# Patient Record
Sex: Female | Born: 1960 | Race: White | Hispanic: No | Marital: Married | Smoking: Current some day smoker
Health system: Southern US, Community
[De-identification: ages and names within clinical notes are randomized; demographics above are authoritative.]

## PROBLEM LIST (undated history)

## (undated) DIAGNOSIS — J449 Chronic obstructive pulmonary disease, unspecified: Secondary | ICD-10-CM

## (undated) DIAGNOSIS — I10 Essential (primary) hypertension: Secondary | ICD-10-CM

## (undated) DIAGNOSIS — C801 Malignant (primary) neoplasm, unspecified: Secondary | ICD-10-CM

## (undated) DIAGNOSIS — J45909 Unspecified asthma, uncomplicated: Secondary | ICD-10-CM

## (undated) HISTORY — PX: CHOLECYSTECTOMY: SHX55

## (undated) HISTORY — PX: OTHER SURGICAL HISTORY: SHX169

---

## 2020-06-15 ENCOUNTER — Ambulatory Visit: Payer: Medicaid Other | Admitting: Gerontology

## 2020-06-15 ENCOUNTER — Other Ambulatory Visit: Payer: Self-pay

## 2020-07-10 ENCOUNTER — Emergency Department: Payer: Medicaid Other

## 2020-07-10 ENCOUNTER — Other Ambulatory Visit: Payer: Self-pay

## 2020-07-10 ENCOUNTER — Emergency Department
Admission: EM | Admit: 2020-07-10 | Discharge: 2020-07-10 | Disposition: A | Discharge status: Home / Self Care | Payer: Medicaid Other | Attending: Emergency Medicine | Admitting: Emergency Medicine

## 2020-07-10 DIAGNOSIS — Z5321 Procedure and treatment not carried out due to patient leaving prior to being seen by health care provider: Secondary | ICD-10-CM | POA: Diagnosis not present

## 2020-07-10 DIAGNOSIS — R0602 Shortness of breath: Secondary | ICD-10-CM | POA: Insufficient documentation

## 2020-07-10 HISTORY — DX: Essential (primary) hypertension: I10

## 2020-07-10 HISTORY — DX: Malignant (primary) neoplasm, unspecified: C80.1

## 2020-07-10 HISTORY — DX: Chronic obstructive pulmonary disease, unspecified: J44.9

## 2020-07-10 HISTORY — DX: Unspecified asthma, uncomplicated: J45.909

## 2020-07-10 LAB — BASIC METABOLIC PANEL WITH GFR
Anion gap: 14 (ref 5–15)
BUN: 22 mg/dL — ABNORMAL HIGH (ref 6–20)
CO2: 32 mmol/L (ref 22–32)
Calcium: 9.9 mg/dL (ref 8.9–10.3)
Chloride: 92 mmol/L — ABNORMAL LOW (ref 98–111)
Creatinine, Ser: 0.89 mg/dL (ref 0.44–1.00)
GFR calc Af Amer: >60 mL/min (ref >60)
GFR calc non Af Amer: >60 mL/min (ref >60)
Glucose, Bld: 130 mg/dL — ABNORMAL HIGH (ref 70–99)
Potassium: 3.2 mmol/L — ABNORMAL LOW (ref 3.5–5.1)
Sodium: 138 mmol/L (ref 135–145)

## 2020-07-10 LAB — CBC
HCT: 40.6 % (ref 36.0–46.0)
Hemoglobin: 13.4 g/dL (ref 12.0–15.0)
MCH: 28.2 pg (ref 26.0–34.0)
MCHC: 33 g/dL (ref 30.0–36.0)
MCV: 85.5 fL (ref 80.0–100.0)
Platelets: 328 10*3/uL (ref 150–400)
RBC: 4.75 MIL/uL (ref 3.87–5.11)
RDW: 12.6 % (ref 11.5–15.5)
WBC: 9.3 10*3/uL (ref 4.0–10.5)
nRBC: 0 % (ref 0.0–0.2)

## 2020-07-10 LAB — TROPONIN I (HIGH SENSITIVITY): Troponin I (High Sensitivity): <2 ng/L (ref <18)

## 2020-07-10 NOTE — ED Triage Notes (Signed)
Pt in via POV, reports ongoing shortness of breath x one week w/ fluctuation in oxygen saturation between 86-96%.  Reports currently on steroid and antibiotic therapy since Monday.  Vitals WDL, NAD noted.

## 2020-07-13 ENCOUNTER — Telehealth: Payer: Self-pay | Admitting: Emergency Medicine

## 2020-07-13 NOTE — Telephone Encounter (Signed)
Called patient due to lwot to inquire about condition and follow up plans. Says she is okay and has dr in [place].  She has made appt with new dr here in September.  She asked about labs.  I gave her potassium level and she will call her doctor and ask about taking potassium again as she has some left.  I told her the exact level so she can tell her doctor and they can make decision about taking potassium pills and dose.  I told her that she may return any time if she gets In trouble with her breathing.

## 2021-03-29 ENCOUNTER — Encounter: Payer: Self-pay | Admitting: Family Medicine

## 2021-04-19 ENCOUNTER — Ambulatory Visit: Payer: Medicaid Other | Admitting: Gastroenterology

## 2021-05-17 ENCOUNTER — Ambulatory Visit
Admission: RE | Admit: 2021-05-17 | Discharge: 2021-05-17 | Disposition: A | Discharge status: Home / Self Care | Payer: Medicaid Other | Source: Ambulatory Visit | Attending: Gastroenterology | Admitting: Gastroenterology

## 2021-05-17 ENCOUNTER — Encounter (INDEPENDENT_AMBULATORY_CARE_PROVIDER_SITE_OTHER): Payer: Self-pay

## 2021-05-17 ENCOUNTER — Encounter: Payer: Self-pay | Admitting: Emergency Medicine

## 2021-05-17 ENCOUNTER — Ambulatory Visit: Payer: Medicaid Other | Admitting: Gastroenterology

## 2021-05-17 ENCOUNTER — Encounter: Payer: Self-pay | Admitting: Gastroenterology

## 2021-05-17 ENCOUNTER — Other Ambulatory Visit: Payer: Self-pay

## 2021-05-17 ENCOUNTER — Ambulatory Visit
Admission: RE | Admit: 2021-05-17 | Discharge: 2021-05-17 | Disposition: A | Discharge status: Home / Self Care | Payer: Medicaid Other | Attending: Gastroenterology | Admitting: Gastroenterology

## 2021-05-17 VITALS — BP 107/70 | HR 97 | Temp 97.7°F | Ht 61.0 in | Wt 157.6 lb

## 2021-05-17 DIAGNOSIS — F32A Depression, unspecified: Secondary | ICD-10-CM | POA: Diagnosis present

## 2021-05-17 DIAGNOSIS — R194 Change in bowel habit: Secondary | ICD-10-CM | POA: Insufficient documentation

## 2021-05-17 DIAGNOSIS — R945 Abnormal results of liver function studies: Secondary | ICD-10-CM | POA: Diagnosis not present

## 2021-05-17 DIAGNOSIS — K5909 Other constipation: Principal | ICD-10-CM | POA: Diagnosis present

## 2021-05-17 DIAGNOSIS — Z6830 Body mass index (BMI) 30.0-30.9, adult: Secondary | ICD-10-CM

## 2021-05-17 DIAGNOSIS — G4700 Insomnia, unspecified: Secondary | ICD-10-CM | POA: Diagnosis present

## 2021-05-17 DIAGNOSIS — Z9049 Acquired absence of other specified parts of digestive tract: Secondary | ICD-10-CM

## 2021-05-17 DIAGNOSIS — K449 Diaphragmatic hernia without obstruction or gangrene: Secondary | ICD-10-CM | POA: Diagnosis present

## 2021-05-17 DIAGNOSIS — J9611 Chronic respiratory failure with hypoxia: Secondary | ICD-10-CM | POA: Diagnosis present

## 2021-05-17 DIAGNOSIS — Z7951 Long term (current) use of inhaled steroids: Secondary | ICD-10-CM

## 2021-05-17 DIAGNOSIS — N179 Acute kidney failure, unspecified: Secondary | ICD-10-CM | POA: Diagnosis present

## 2021-05-17 DIAGNOSIS — F419 Anxiety disorder, unspecified: Secondary | ICD-10-CM | POA: Diagnosis present

## 2021-05-17 DIAGNOSIS — K219 Gastro-esophageal reflux disease without esophagitis: Secondary | ICD-10-CM | POA: Diagnosis present

## 2021-05-17 DIAGNOSIS — Z9981 Dependence on supplemental oxygen: Secondary | ICD-10-CM

## 2021-05-17 DIAGNOSIS — D509 Iron deficiency anemia, unspecified: Secondary | ICD-10-CM | POA: Diagnosis present

## 2021-05-17 DIAGNOSIS — Z28310 Unvaccinated for covid-19: Secondary | ICD-10-CM

## 2021-05-17 DIAGNOSIS — J449 Chronic obstructive pulmonary disease, unspecified: Secondary | ICD-10-CM | POA: Diagnosis present

## 2021-05-17 DIAGNOSIS — I700 Atherosclerosis of aorta: Secondary | ICD-10-CM | POA: Diagnosis present

## 2021-05-17 DIAGNOSIS — K635 Polyp of colon: Secondary | ICD-10-CM | POA: Diagnosis present

## 2021-05-17 DIAGNOSIS — J90 Pleural effusion, not elsewhere classified: Secondary | ICD-10-CM | POA: Diagnosis present

## 2021-05-17 DIAGNOSIS — Z20822 Contact with and (suspected) exposure to covid-19: Secondary | ICD-10-CM | POA: Diagnosis present

## 2021-05-17 DIAGNOSIS — R7989 Other specified abnormal findings of blood chemistry: Secondary | ICD-10-CM

## 2021-05-17 DIAGNOSIS — E785 Hyperlipidemia, unspecified: Secondary | ICD-10-CM | POA: Diagnosis present

## 2021-05-17 DIAGNOSIS — E669 Obesity, unspecified: Secondary | ICD-10-CM | POA: Diagnosis present

## 2021-05-17 DIAGNOSIS — E876 Hypokalemia: Secondary | ICD-10-CM | POA: Diagnosis present

## 2021-05-17 DIAGNOSIS — Z79899 Other long term (current) drug therapy: Secondary | ICD-10-CM

## 2021-05-17 DIAGNOSIS — I10 Essential (primary) hypertension: Secondary | ICD-10-CM | POA: Diagnosis present

## 2021-05-17 DIAGNOSIS — F17210 Nicotine dependence, cigarettes, uncomplicated: Secondary | ICD-10-CM | POA: Diagnosis present

## 2021-05-17 DIAGNOSIS — R109 Unspecified abdominal pain: Secondary | ICD-10-CM | POA: Diagnosis present

## 2021-05-17 LAB — URINALYSIS, COMPLETE (UACMP) WITH MICROSCOPIC
Bacteria, UA: NONE SEEN
Bilirubin Urine: NEGATIVE
Glucose, UA: NEGATIVE mg/dL
Hgb urine dipstick: NEGATIVE
Ketones, ur: NEGATIVE mg/dL
Leukocytes,Ua: NEGATIVE
Nitrite: NEGATIVE
Protein, ur: NEGATIVE mg/dL
Specific Gravity, Urine: 1.013 (ref 1.005–1.030)
pH: 7 (ref 5.0–8.0)

## 2021-05-17 LAB — COMPREHENSIVE METABOLIC PANEL
ALT: 39 U/L (ref 0–44)
AST: 39 U/L (ref 15–41)
Albumin: 4.6 g/dL (ref 3.5–5.0)
Alkaline Phosphatase: 120 U/L (ref 38–126)
Anion gap: 10 (ref 5–15)
BUN: 28 mg/dL — ABNORMAL HIGH (ref 6–20)
CO2: 35 mmol/L — ABNORMAL HIGH (ref 22–32)
Calcium: 9.6 mg/dL (ref 8.9–10.3)
Chloride: 89 mmol/L — ABNORMAL LOW (ref 98–111)
Creatinine, Ser: 1.68 mg/dL — ABNORMAL HIGH (ref 0.44–1.00)
GFR, Estimated: 35 mL/min — ABNORMAL LOW (ref >60)
Glucose, Bld: 113 mg/dL — ABNORMAL HIGH (ref 70–99)
Potassium: 2.5 mmol/L — CL (ref 3.5–5.1)
Sodium: 134 mmol/L — ABNORMAL LOW (ref 135–145)
Total Bilirubin: 0.8 mg/dL (ref 0.3–1.2)
Total Protein: 7.7 g/dL (ref 6.5–8.1)

## 2021-05-17 LAB — CBC
HCT: 39.1 % (ref 36.0–46.0)
Hemoglobin: 13 g/dL (ref 12.0–15.0)
MCH: 26.5 pg (ref 26.0–34.0)
MCHC: 33.2 g/dL (ref 30.0–36.0)
MCV: 79.8 fL — ABNORMAL LOW (ref 80.0–100.0)
Platelets: 255 10*3/uL (ref 150–400)
RBC: 4.9 MIL/uL (ref 3.87–5.11)
RDW: 12.5 % (ref 11.5–15.5)
WBC: 7.7 10*3/uL (ref 4.0–10.5)
nRBC: 0 % (ref 0.0–0.2)

## 2021-05-17 LAB — LIPASE, BLOOD: Lipase: 35 U/L (ref 11–51)

## 2021-05-17 MED ORDER — PEG 3350-KCL-NA BICARB-NACL 420 G PO SOLR
ORAL | 0 refills | Status: DC
Start: 2021-05-17 — End: 2021-08-07

## 2021-05-17 MED ORDER — MINERAL OIL RE ENEM: 1.0000 | ENEMA | Freq: Once | RECTAL | 0 refills | Status: AC

## 2021-05-17 NOTE — ED Triage Notes (Signed)
Pt with c/o constipation abdominal pain, N/V x3 weeks.

## 2021-05-17 NOTE — Progress Notes (Signed)
Jonathon Bellows MD, MRCP(U.K) [address]  [place]  [place], [place] [address]  Main: [phone]  Fax: [phone]   Gastroenterology Consultation  Referring Provider:     Ranae Plumber, [place] Primary Care Physician:  Center, [facility] Primary Gastroenterologist:  Dr. Jonathon Bellows  Reason for Consultation:     GERD, constipation and weight gain        HPI:   Shequila Neglia is a 60 y.o. y/o female referred for consultation & management  by  Center, [employer].     She has been referred for GERD, constipation and weight gain Recent TSH checked was normal labs from April 2022: Creatinine 0.78, alkaline phosphatase elevated at 169 and ALT 36 TSH normal no CBC available  She is here today per her account to see me for constipation.  She says she has no issues with acid reflux.  She says usually she has a bowel movement once a week but she not had a bowel movement now for over 2 weeks.  Feels nauseous.  Passing some gas.  Tried multiple over-the-counter options but not help.  She recollects her last colonoscopy was some years back and was normal.  Denies any family history of colon cancer or polyps.  Denies any unintentional weight loss.  Denies any use of any opiates. Past Medical History:  Diagnosis Date   Asthma    Cancer ([place])    COPD (chronic obstructive pulmonary disease) ([place])    Hypertension     Past Surgical History:  Procedure Laterality Date   cesection     CHOLECYSTECTOMY      Prior to Admission medications   Medication Sig Start Date End Date Taking? Authorizing Provider  Cyanocobalamin 1000 MCG CAPS SMARTSIG:By Mouth 05/16/21   [provider]    No family history on file.   Social History   Tobacco Use   Smoking status: Every Day    Packs/day: 0.50    Pack years: 0.00    Types: Cigarettes   Smokeless tobacco: Never  Substance Use Topics   Alcohol use: Not Currently    Allergies as of 05/17/2021   (Not on File)     Review of Systems:    All systems reviewed and negative except where noted in HPI.   Physical Exam:  BP 107/70   Pulse 97   Temp 97.7 F (36.5 C) (Oral)   Ht 5\' 1"  (1.549 m)   Wt 157 lb 9.6 oz (71.5 kg)   BMI 29.78 kg/m  No LMP recorded. Patient is postmenopausal. Psych:  Alert and cooperative. Normal mood and affect. General:   Alert,  Well-developed, well-nourished, pleasant and cooperative in NAD Head:  Normocephalic and atraumatic. Eyes:  Sclera clear, no icterus.   Conjunctiva pink. Ears:  Normal auditory acuity. Lungs:  Respirations even and unlabored.  Clear throughout to auscultation.   No wheezes, crackles, or rhonchi. No acute distress. Heart:  Regular rate and rhythm; no murmurs, clicks, rubs, or gallops.  She had a cardiac monitor attached to her chest Abdomen:  Normal bowel sounds.  No bruits.  Soft, non-tender and non-distended without masses, hepatosplenomegaly or hernias noted.  No guarding or rebound tenderness.    Neurologic:  Alert and oriented x3;  grossly normal neurologically. Psych:  Alert and cooperative. Normal mood and affect.  Imaging Studies: No results found.  Assessment and Plan:   Nickola Lenig is a 60 y.o. y/o female has been referred for GERD and constipation.  She denies any history of GERD.  She states the main reason she is here today is for severe constipation.  She not had a bowel movement for over 2-1/2 weeks.  Clinically she does not appear to be obstructed she has no vomiting.  I also note an elevated alkaline phosphatase in her labs which has been scanned in the chart from her referring physician.  We will obtain further labs to determine etiology   Plan 1.  Obtain x-ray of the abdomen to evaluate severity of constipation 2.  Over-the-counter fleets enema followed by drinking a bottle of GoLytely slowly to the point where she feels the contents of her colon have completely been evacuated.  She has been instructed that if she starts  getting nausea vomiting and worsening of abdominal distention to come to the emergency room. 3.  Check CBC CMP CRP. 4.  To evaluate change in bowel movements she will need a colonoscopy after cardiac clearance as she is being evaluated for an irregular heart rate and has a cardiac monitor in place   I have discussed alternative options, risks & benefits,  which include, but are not limited to, bleeding, infection, perforation,respiratory complication & drug reaction.  The patient agrees with this plan & written consent will be obtained.     Follow up in video visit next week  Dr Jonathon Bellows MD,MRCP(U.K)

## 2021-05-17 NOTE — ED Triage Notes (Signed)
EMS brings pt in from home for c/o abd pain & constipation x 3wks

## 2021-05-17 NOTE — Patient Instructions (Signed)
Please go to the [facility] and have your X-Ray taken. Then go to your pharmacy and pick up your prescriptions. Once you go home, do the fleet enema and then start taking the Golytely. Take 8 oz of mixture every 15 minutes until it's all gone or until you feel empty. If you still do not have a bowel movement, please go to the Emergency Room.

## 2021-05-18 ENCOUNTER — Emergency Department: Payer: Medicaid Other

## 2021-05-18 ENCOUNTER — Telehealth: Payer: Self-pay

## 2021-05-18 ENCOUNTER — Inpatient Hospital Stay
Admission: EM | Admit: 2021-05-18 | Discharge: 2021-05-23 | DRG: 392 | Disposition: A | Discharge status: Home / Self Care | Payer: Medicaid Other | Attending: Family Medicine | Admitting: Family Medicine

## 2021-05-18 DIAGNOSIS — E876 Hypokalemia: Secondary | ICD-10-CM | POA: Diagnosis present

## 2021-05-18 DIAGNOSIS — K5900 Constipation, unspecified: Secondary | ICD-10-CM | POA: Diagnosis not present

## 2021-05-18 DIAGNOSIS — R1032 Left lower quadrant pain: Secondary | ICD-10-CM

## 2021-05-18 DIAGNOSIS — E785 Hyperlipidemia, unspecified: Secondary | ICD-10-CM

## 2021-05-18 DIAGNOSIS — R109 Unspecified abdominal pain: Secondary | ICD-10-CM | POA: Diagnosis present

## 2021-05-18 DIAGNOSIS — N179 Acute kidney failure, unspecified: Secondary | ICD-10-CM | POA: Diagnosis not present

## 2021-05-18 DIAGNOSIS — Z720 Tobacco use: Secondary | ICD-10-CM

## 2021-05-18 DIAGNOSIS — E782 Mixed hyperlipidemia: Secondary | ICD-10-CM

## 2021-05-18 DIAGNOSIS — J4489 Other specified chronic obstructive pulmonary disease: Secondary | ICD-10-CM

## 2021-05-18 DIAGNOSIS — J449 Chronic obstructive pulmonary disease, unspecified: Secondary | ICD-10-CM

## 2021-05-18 LAB — COMPREHENSIVE METABOLIC PANEL
ALT: 38 IU/L — ABNORMAL HIGH (ref 0–32)
AST: 35 IU/L (ref 0–40)
Albumin/Globulin Ratio: 2.3 — ABNORMAL HIGH (ref 1.2–2.2)
Albumin: 5 g/dL — ABNORMAL HIGH (ref 3.8–4.9)
Alkaline Phosphatase: 153 IU/L — ABNORMAL HIGH (ref 44–121)
BUN/Creatinine Ratio: 14 (ref 9–23)
BUN: 23 mg/dL (ref 6–24)
Bilirubin Total: 0.4 mg/dL (ref 0.0–1.2)
CO2: 31 mmol/L — ABNORMAL HIGH (ref 20–29)
Calcium: 10.1 mg/dL (ref 8.7–10.2)
Chloride: 90 mmol/L — ABNORMAL LOW (ref 96–106)
Creatinine, Ser: 1.7 mg/dL — ABNORMAL HIGH (ref 0.57–1.00)
Globulin, Total: 2.2 g/dL (ref 1.5–4.5)
Glucose: 87 mg/dL (ref 65–99)
Potassium: 3.4 mmol/L — ABNORMAL LOW (ref 3.5–5.2)
Sodium: 138 mmol/L (ref 134–144)
Total Protein: 7.2 g/dL (ref 6.0–8.5)
eGFR: 35 mL/min/{1.73_m2} — ABNORMAL LOW (ref >59)

## 2021-05-18 LAB — CBC WITH DIFFERENTIAL/PLATELET
Basophils Absolute: 0.1 10*3/uL (ref 0.0–0.2)
Basos: 1 %
EOS (ABSOLUTE): 0.2 10*3/uL (ref 0.0–0.4)
Eos: 3 %
Hematocrit: 40.2 % (ref 34.0–46.6)
Hemoglobin: 13.5 g/dL (ref 11.1–15.9)
Immature Grans (Abs): 0 10*3/uL (ref 0.0–0.1)
Immature Granulocytes: 0 %
Lymphocytes Absolute: 2.3 10*3/uL (ref 0.7–3.1)
Lymphs: 29 %
MCH: 26.8 pg (ref 26.6–33.0)
MCHC: 33.6 g/dL (ref 31.5–35.7)
MCV: 80 fL (ref 79–97)
Monocytes Absolute: 0.7 10*3/uL (ref 0.1–0.9)
Monocytes: 9 %
Neutrophils Absolute: 4.5 10*3/uL (ref 1.4–7.0)
Neutrophils: 58 %
Platelets: 272 10*3/uL (ref 150–450)
RBC: 5.04 x10E6/uL (ref 3.77–5.28)
RDW: 12.7 % (ref 11.7–15.4)
WBC: 7.7 10*3/uL (ref 3.4–10.8)

## 2021-05-18 LAB — MITOCHONDRIAL/SMOOTH MUSCLE AB PNL
Mitochondrial Ab: <20 Units (ref 0.0–20.0)
Smooth Muscle Ab: 3 Units (ref 0–19)

## 2021-05-18 LAB — BASIC METABOLIC PANEL
Anion gap: 8 (ref 5–15)
BUN: 23 mg/dL — ABNORMAL HIGH (ref 6–20)
CO2: 33 mmol/L — ABNORMAL HIGH (ref 22–32)
Calcium: 8.9 mg/dL (ref 8.9–10.3)
Chloride: 93 mmol/L — ABNORMAL LOW (ref 98–111)
Creatinine, Ser: 1.13 mg/dL — ABNORMAL HIGH (ref 0.44–1.00)
GFR, Estimated: 56 mL/min — ABNORMAL LOW (ref >60)
Glucose, Bld: 97 mg/dL (ref 70–99)
Potassium: 3 mmol/L — ABNORMAL LOW (ref 3.5–5.1)
Sodium: 134 mmol/L — ABNORMAL LOW (ref 135–145)

## 2021-05-18 LAB — GAMMA GT: GGT: 44 IU/L (ref 0–60)

## 2021-05-18 LAB — RESP PANEL BY RT-PCR (FLU A&B, COVID) ARPGX2
Influenza A by PCR: NEGATIVE
Influenza B by PCR: NEGATIVE
SARS Coronavirus 2 by RT PCR: NEGATIVE

## 2021-05-18 LAB — HIV ANTIBODY (ROUTINE TESTING W REFLEX): HIV Screen 4th Generation wRfx: NONREACTIVE

## 2021-05-18 LAB — PTH, INTACT AND CALCIUM: PTH: 38 pg/mL (ref 15–65)

## 2021-05-18 MED ORDER — LACTATED RINGERS IV BOLUS
1000.0000 mL | Freq: Once | INTRAVENOUS | Status: AC
  Administered 2021-05-18: 1000 mL via INTRAVENOUS

## 2021-05-18 MED ORDER — TRAZODONE HCL 100 MG PO TABS
200.0000 mg | ORAL_TABLET | Freq: Every day | ORAL | Status: DC
  Administered 2021-05-18 – 2021-05-22 (×5): 200 mg via ORAL
  Filled 2021-05-18 (×5): qty 2

## 2021-05-18 MED ORDER — ZOLPIDEM TARTRATE 5 MG PO TABS
10.0000 mg | ORAL_TABLET | Freq: Every day | ORAL | Status: DC
  Administered 2021-05-18 – 2021-05-22 (×5): 10 mg via ORAL
  Filled 2021-05-18 (×5): qty 2

## 2021-05-18 MED ORDER — ENOXAPARIN SODIUM 40 MG/0.4ML IJ SOSY
40.0000 mg | PREFILLED_SYRINGE | Freq: Every day | INTRAMUSCULAR | Status: DC
  Administered 2021-05-18 – 2021-05-22 (×5): 40 mg via SUBCUTANEOUS
  Filled 2021-05-18 (×5): qty 0.4

## 2021-05-18 MED ORDER — LIDOCAINE VISCOUS HCL 2 % MT SOLN
15.0000 mL | Freq: Once | OROMUCOSAL | Status: AC
  Administered 2021-05-18: 15 mL via OROMUCOSAL
  Filled 2021-05-18: qty 15

## 2021-05-18 MED ORDER — DOCUSATE SODIUM 50 MG/5ML PO LIQD
100.0000 mg | ORAL | Status: AC
  Administered 2021-05-18: 100 mg
  Filled 2021-05-18: qty 10

## 2021-05-18 MED ORDER — MAGNESIUM CITRATE PO SOLN
0.5000 | Freq: Once | ORAL | Status: AC
  Administered 2021-05-18: 0.5 via ORAL
  Filled 2021-05-18: qty 296

## 2021-05-18 MED ORDER — ACETAMINOPHEN 325 MG PO TABS: 650.0000 mg | ORAL_TABLET | Freq: Four times a day (QID) | ORAL | Status: AC | PRN

## 2021-05-18 MED ORDER — MAGNESIUM SULFATE 2 GM/50ML IV SOLN
2.0000 g | Freq: Once | INTRAVENOUS | Status: AC
  Administered 2021-05-18: 2 g via INTRAVENOUS
  Filled 2021-05-18: qty 50

## 2021-05-18 MED ORDER — ONDANSETRON HCL 4 MG PO TABS: 4.0000 mg | ORAL_TABLET | Freq: Four times a day (QID) | ORAL | Status: DC | PRN

## 2021-05-18 MED ORDER — LACTATED RINGERS IV SOLN: INTRAVENOUS | Status: AC

## 2021-05-18 MED ORDER — HYDROCHLOROTHIAZIDE 25 MG PO TABS
25.0000 mg | ORAL_TABLET | Freq: Every day | ORAL | Status: DC
  Administered 2021-05-18: 25 mg via ORAL
  Filled 2021-05-18: qty 1

## 2021-05-18 MED ORDER — CITALOPRAM HYDROBROMIDE 20 MG PO TABS
20.0000 mg | ORAL_TABLET | Freq: Every day | ORAL | Status: DC
  Administered 2021-05-18 – 2021-05-23 (×6): 20 mg via ORAL
  Filled 2021-05-18 (×6): qty 1

## 2021-05-18 MED ORDER — KETOROLAC TROMETHAMINE 30 MG/ML IJ SOLN
30.0000 mg | Freq: Once | INTRAMUSCULAR | Status: AC
  Administered 2021-05-18: 30 mg via INTRAVENOUS
  Filled 2021-05-18: qty 1

## 2021-05-18 MED ORDER — PEG 3350-KCL-NA BICARB-NACL 420 G PO SOLR
4000.0000 mL | Freq: Once | ORAL | Status: AC
  Administered 2021-05-18: 4000 mL via ORAL
  Filled 2021-05-18: qty 4000

## 2021-05-18 MED ORDER — NICOTINE 21 MG/24HR TD PT24
21.0000 mg | MEDICATED_PATCH | Freq: Every day | TRANSDERMAL | Status: DC
  Administered 2021-05-18 – 2021-05-23 (×6): 21 mg via TRANSDERMAL
  Filled 2021-05-18 (×6): qty 1

## 2021-05-18 MED ORDER — POTASSIUM CHLORIDE CRYS ER 20 MEQ PO TBCR
40.0000 meq | EXTENDED_RELEASE_TABLET | Freq: Once | ORAL | Status: AC
  Administered 2021-05-18: 40 meq via ORAL
  Filled 2021-05-18: qty 2

## 2021-05-18 MED ORDER — PRAZOSIN HCL 2 MG PO CAPS
2.0000 mg | ORAL_CAPSULE | Freq: Every day | ORAL | Status: DC
  Administered 2021-05-19 – 2021-05-22 (×4): 2 mg via ORAL
  Filled 2021-05-18 (×7): qty 1

## 2021-05-18 MED ORDER — ONDANSETRON HCL 4 MG/2ML IJ SOLN
4.0000 mg | Freq: Four times a day (QID) | INTRAMUSCULAR | Status: DC | PRN
  Administered 2021-05-22 – 2021-05-23 (×3): 4 mg via INTRAVENOUS
  Filled 2021-05-18 (×3): qty 2

## 2021-05-18 MED ORDER — POTASSIUM CHLORIDE 10 MEQ/100ML IV SOLN
10.0000 meq | INTRAVENOUS | Status: DC
  Administered 2021-05-18 (×4): 10 meq via INTRAVENOUS
  Filled 2021-05-18 (×4): qty 100

## 2021-05-18 MED ORDER — FLUOXETINE HCL 20 MG PO CAPS
40.0000 mg | ORAL_CAPSULE | Freq: Two times a day (BID) | ORAL | Status: DC
  Administered 2021-05-18 – 2021-05-23 (×11): 40 mg via ORAL
  Filled 2021-05-18 (×13): qty 2

## 2021-05-18 MED ORDER — ATORVASTATIN CALCIUM 20 MG PO TABS
20.0000 mg | ORAL_TABLET | Freq: Every day | ORAL | Status: DC
  Administered 2021-05-18 – 2021-05-23 (×6): 20 mg via ORAL
  Filled 2021-05-18 (×6): qty 1

## 2021-05-18 MED ORDER — IOHEXOL 300 MG/ML  SOLN
75.0000 mL | Freq: Once | INTRAMUSCULAR | Status: AC | PRN
  Administered 2021-05-18: 75 mL via INTRAVENOUS

## 2021-05-18 MED ORDER — LACTULOSE 10 GM/15ML PO SOLN
20.0000 g | Freq: Once | ORAL | Status: AC
  Administered 2021-05-18: 20 g via ORAL
  Filled 2021-05-18: qty 30

## 2021-05-18 MED ORDER — QUETIAPINE FUMARATE 300 MG PO TABS
600.0000 mg | ORAL_TABLET | Freq: Every day | ORAL | Status: DC
  Administered 2021-05-18 – 2021-05-22 (×5): 600 mg via ORAL
  Filled 2021-05-18 (×6): qty 2

## 2021-05-18 MED ORDER — ACETAMINOPHEN 650 MG RE SUPP: 650.0000 mg | Freq: Four times a day (QID) | RECTAL | Status: AC | PRN

## 2021-05-18 NOTE — Telephone Encounter (Signed)
-----   Message from Jonathon Bellows, MD sent at 05/18/2021 10:07 AM EDT ----- Noted patient in ER, will need office visit week after my consults week

## 2021-05-18 NOTE — Telephone Encounter (Signed)
Called patient but was not able to leave her a voicemail. However, I will schedule her a follow up appointment with Dr. Vicente Males and then I will mail her the appointment information.

## 2021-05-18 NOTE — Telephone Encounter (Signed)
Called patient but was not able to leave her a voicemail since the mailbox is full. I will try to call her again later on the day. However, yesterday I had sent en enema and a golytely to her pharmacy for her to pick up per Dr. Georgeann Oppenheim orders.

## 2021-05-18 NOTE — ED Notes (Signed)
XR at bedside

## 2021-05-18 NOTE — ED Notes (Signed)
Pt currently on bedside commode at this time.

## 2021-05-18 NOTE — Telephone Encounter (Signed)
Cardiac clearance was faxed to Dr. Raul Del since he was the one to order the heart monitor that the patient currently has on.  Once we obtain one, we will have to schedule patient a colonoscopy.   Dr. Raul Del [phone] Phone [phone] Fax

## 2021-05-18 NOTE — ED Provider Notes (Signed)
8:38 AM Assumed care for off going team.   Blood pressure 114/81, pulse 87, temperature 98 F (36.7 C), temperature source Oral, resp. rate 20, height 5\' 1"  (1.549 m), weight 73 kg, SpO2 94 %.  See their HPI for full report but in brief follow u on CT   Moderate distention of the colon with fluid and stool all the way  down to the rectosigmoid junction. The rectum is relatively  decompressed. No findings for acute inflammatory process, mass  lesion or obstruction. Patient may require sigmoidoscopy to exclude  narrowing or stricture at the rectosigmoid junction.  2. Status post cholecystectomy with mild associated common bile duct  dilatation.  3. Moderate to large hiatal hernia.  4. Age advanced atherosclerotic calcifications involving the aorta  and iliac arteries.  5. Aortic atherosclerosis.    9:10 AM Patient has been unable to have a bowel movement with multiple interventions.  CT scan is as below above.  Discussed with Dr. Haig Prophet about potential sigmoidoscopy.  He states that patient still needs to have a further bowel prep before they can potentially do this.  He will come see patient.  Will discuss possible team for admission for hypokalemia, continue bowel regimen and potential GI intervention     Vanessa [place], MD 05/18/21 [phone]

## 2021-05-18 NOTE — ED Notes (Signed)
ED Provider at bedside. 

## 2021-05-18 NOTE — Consult Note (Signed)
Consultation  Referring Provider: ED   Admit date: 05/18/2021 Consult date: 05/18/2021         Reason for Consultation:     Constipation         HPI:   Ophelia Sipe is a 60 y.o. lady with history of hypertension and COPD who is here for severe constipation. Patient was actually seen in Dr. Georgeann Oppenheim clinic yesterday but patient developed worsening constipation that was not responsive to over the counter laxatives. Patient had CT scan showing severe constipation but no mass or inflammation. They did mention a possible stricture as a cause but did not actually see a stricture. Patient states she had colonoscopy 2-3 years ago in [place] and it was normal. Patient's mother had cancer but she is not sure what kind. She has not started any medicines. Had some vomiting today but feels hungry. Patient has had chronic issues with constipation. No blood thinners.    Past Medical History:  Diagnosis Date   Asthma    Cancer ([place])    COPD (chronic obstructive pulmonary disease) ([place])    Hypertension     Past Surgical History:  Procedure Laterality Date   cesection     CHOLECYSTECTOMY      History reviewed. No pertinent family history.   Social History   Tobacco Use   Smoking status: Every Day    Packs/day: 0.50    Pack years: 0.00    Types: Cigarettes   Smokeless tobacco: Never  Substance Use Topics   Alcohol use: Not Currently    Prior to Admission medications   Medication Sig Start Date End Date Taking? Authorizing Provider  albuterol (PROVENTIL) (2.5 MG/3ML) 0.083% nebulizer solution Take 2.5 mg by nebulization every 4 (four) hours as needed for wheezing or shortness of breath.   Yes [provider]  albuterol (VENTOLIN HFA) 108 (90 Base) MCG/ACT inhaler Inhale 2 puffs into the lungs every 4 (four) hours as needed for wheezing or shortness of breath.   Yes [provider]  Ascorbic Acid (VITAMIN C) 1000 MG tablet Take 1,000 mg by mouth daily.   Yes [provider]  atorvastatin (LIPITOR) 20 MG tablet Take 20 mg by mouth daily.   Yes [provider]  citalopram (CELEXA) 20 MG tablet Take 20 mg by mouth daily.   Yes [provider]  cyanocobalamin (,VITAMIN B-12,) 1000 MCG/ML injection Inject 1,000 mcg into the muscle every 30 (thirty) days.   Yes [provider]  docusate sodium (COLACE) 100 MG capsule Take 200 mg by mouth daily.   Yes [provider]  ferrous sulfate 325 (65 FE) MG tablet Take 325 mg by mouth 3 (three) times daily with meals.   Yes [provider]  FLUoxetine (PROZAC) 40 MG capsule Take 40 mg by mouth 2 (two) times daily. 04/17/21  Yes [provider]  fluticasone (FLONASE) 50 MCG/ACT nasal spray Place 1 spray into both nostrils daily. 02/14/21  Yes [provider]  fluticasone-salmeterol (ADVAIR) 250-50 MCG/ACT AEPB Inhale 1 puff into the lungs in the morning and at bedtime. 04/24/21  Yes [provider]  folic acid (FOLVITE) 1 MG tablet Take 1 mg by mouth daily.   Yes [provider]  furosemide (LASIX) 20 MG tablet Take 20 mg by mouth daily.   Yes [provider]  hydrochlorothiazide (HYDRODIURIL) 25 MG tablet Take 25 mg by mouth daily.   Yes [provider]  hydrOXYzine (ATARAX/VISTARIL) 50 MG tablet Take 100 mg by mouth  every 8 (eight) hours as needed for itching or anxiety.   Yes [provider]  linaclotide (LINZESS) 145 MCG CAPS capsule Take 145 mcg by mouth daily.   Yes [provider]  loratadine (CLARITIN) 10 MG tablet Take 10 mg by mouth daily.   Yes [provider]  montelukast (SINGULAIR) 10 MG tablet Take 10 mg by mouth daily. 12/12/20  Yes [provider]  Multiple Vitamin (MULTIVITAMIN) tablet Take 1 tablet by mouth daily.   Yes [provider]  nicotine (NICODERM CQ - DOSED IN MG/24 HOURS) 21 mg/24hr patch Place 21 mg onto the skin daily.   Yes [provider]   ondansetron (ZOFRAN) 8 MG tablet Take 8 mg by mouth every 8 (eight) hours as needed for nausea.   Yes [provider]  pantoprazole (PROTONIX) 40 MG tablet Take 40 mg by mouth daily.   Yes [provider]  Potassium Chloride ER 20 MEQ TBCR Take 20 mEq by mouth daily.   Yes [provider]  prazosin (MINIPRESS) 1 MG capsule Take 2 mg by mouth at bedtime.   Yes [provider]  QUEtiapine (SEROQUEL) 300 MG tablet Take 600 mg by mouth at bedtime.   Yes [provider]  tiotropium (SPIRIVA) 18 MCG inhalation capsule Place 18 mcg into inhaler and inhale daily.   Yes [provider]  traZODone (DESYREL) 100 MG tablet Take 200 mg by mouth at bedtime.   Yes [provider]  zolpidem (AMBIEN) 10 MG tablet Take 10 mg by mouth at bedtime.   Yes [provider]  polyethylene glycol-electrolytes (NULYTELY) 420 g solution Drink one 8 oz glass of mixture every 15 minutes until you finish all of the jug or until you feel empty. 05/17/21   Jonathon Bellows, MD    Current Facility-Administered Medications  Medication Dose Route Frequency Provider Last Rate Last Admin   acetaminophen (TYLENOL) tablet 650 mg  650 mg Oral Q6H PRN Cox, Amy N, DO       Or   acetaminophen (TYLENOL) suppository 650 mg  650 mg Rectal Q6H PRN Cox, Amy N, DO       atorvastatin (LIPITOR) tablet 20 mg  20 mg Oral Daily Cox, Amy N, DO       citalopram (CELEXA) tablet 20 mg  20 mg Oral Daily Cox, Amy N, DO       FLUoxetine (PROZAC) capsule 40 mg  40 mg Oral BID Cox, Amy N, DO       hydrochlorothiazide (HYDRODIURIL) tablet 25 mg  25 mg Oral Daily Cox, Amy N, DO       nicotine (NICODERM CQ - dosed in mg/24 hours) patch 21 mg  21 mg Transdermal Daily Cox, Amy N, DO       ondansetron (ZOFRAN) tablet 4 mg  4 mg Oral Q6H PRN Cox, Amy N, DO       Or   ondansetron (ZOFRAN) injection 4 mg  4 mg Intravenous Q6H PRN Cox, Amy N, DO       prazosin (MINIPRESS) capsule 2 mg  2 mg Oral QHS  Cox, Amy N, DO       QUEtiapine (SEROQUEL) tablet 600 mg  600 mg Oral QHS Cox, Amy N, DO       traZODone (DESYREL) tablet 200 mg  200 mg Oral QHS Cox, Amy N, DO       zolpidem (AMBIEN) tablet 10 mg  10 mg Oral QHS Cox, Amy N, DO  Current Outpatient Medications  Medication Sig Dispense Refill   albuterol (PROVENTIL) (2.5 MG/3ML) 0.083% nebulizer solution Take 2.5 mg by nebulization every 4 (four) hours as needed for wheezing or shortness of breath.     albuterol (VENTOLIN HFA) 108 (90 Base) MCG/ACT inhaler Inhale 2 puffs into the lungs every 4 (four) hours as needed for wheezing or shortness of breath.     Ascorbic Acid (VITAMIN C) 1000 MG tablet Take 1,000 mg by mouth daily.     atorvastatin (LIPITOR) 20 MG tablet Take 20 mg by mouth daily.     citalopram (CELEXA) 20 MG tablet Take 20 mg by mouth daily.     cyanocobalamin (,VITAMIN B-12,) 1000 MCG/ML injection Inject 1,000 mcg into the muscle every 30 (thirty) days.     docusate sodium (COLACE) 100 MG capsule Take 200 mg by mouth daily.     ferrous sulfate 325 (65 FE) MG tablet Take 325 mg by mouth 3 (three) times daily with meals.     FLUoxetine (PROZAC) 40 MG capsule Take 40 mg by mouth 2 (two) times daily.     fluticasone (FLONASE) 50 MCG/ACT nasal spray Place 1 spray into both nostrils daily.     fluticasone-salmeterol (ADVAIR) 250-50 MCG/ACT AEPB Inhale 1 puff into the lungs in the morning and at bedtime.     folic acid (FOLVITE) 1 MG tablet Take 1 mg by mouth daily.     furosemide (LASIX) 20 MG tablet Take 20 mg by mouth daily.     hydrochlorothiazide (HYDRODIURIL) 25 MG tablet Take 25 mg by mouth daily.     hydrOXYzine (ATARAX/VISTARIL) 50 MG tablet Take 100 mg by mouth every 8 (eight) hours as needed for itching or anxiety.     linaclotide (LINZESS) 145 MCG CAPS capsule Take 145 mcg by mouth daily.     loratadine (CLARITIN) 10 MG tablet Take 10 mg by mouth daily.     montelukast (SINGULAIR) 10 MG tablet Take 10 mg by mouth daily.      Multiple Vitamin (MULTIVITAMIN) tablet Take 1 tablet by mouth daily.     nicotine (NICODERM CQ - DOSED IN MG/24 HOURS) 21 mg/24hr patch Place 21 mg onto the skin daily.     ondansetron (ZOFRAN) 8 MG tablet Take 8 mg by mouth every 8 (eight) hours as needed for nausea.     pantoprazole (PROTONIX) 40 MG tablet Take 40 mg by mouth daily.     Potassium Chloride ER 20 MEQ TBCR Take 20 mEq by mouth daily.     prazosin (MINIPRESS) 1 MG capsule Take 2 mg by mouth at bedtime.     QUEtiapine (SEROQUEL) 300 MG tablet Take 600 mg by mouth at bedtime.     tiotropium (SPIRIVA) 18 MCG inhalation capsule Place 18 mcg into inhaler and inhale daily.     traZODone (DESYREL) 100 MG tablet Take 200 mg by mouth at bedtime.     zolpidem (AMBIEN) 10 MG tablet Take 10 mg by mouth at bedtime.     polyethylene glycol-electrolytes (NULYTELY) 420 g solution Drink one 8 oz glass of mixture every 15 minutes until you finish all of the jug or until you feel empty. 4000 mL 0    Allergies as of 05/17/2021   (No Known Allergies)     Review of Systems:    All systems reviewed and negative except where noted in HPI.  Review of Systems  Constitutional:  Negative for chills and fever.  Respiratory:  Negative for cough.  Cardiovascular:  Negative for chest pain.  Gastrointestinal:  Positive for constipation, nausea and vomiting. Negative for blood in stool and melena.  Musculoskeletal:  Negative for joint pain.  Skin:  Negative for itching and rash.  Neurological:  Negative for focal weakness.  Psychiatric/Behavioral:  Negative for substance abuse.   All other systems reviewed and are negative.     Physical Exam:  Vital signs in last 24 hours: Temp:  [98 F (36.7 C)] 98 F (36.7 C) (06/23 2139) Pulse Rate:  [70-90] 72 (06/24 1300) Resp:  [18-20] 18 (06/24 1300) BP: (100-128)/(57-81) 109/57 (06/24 1300) SpO2:  [94 %-100 %] 95 % (06/24 1300) Weight:  [71 kg-73 kg] 73 kg (06/24 0138)   General:   Pleasant in  NAD Head:  Normocephalic and atraumatic. Eyes:   No icterus.   Conjunctiva pink. Mouth: Mucosa pink moist, no lesions. Neck:  Supple; no masses felt Lungs:  No respiratory distress  Heart:  RRR Abdomen:   soft but slightly distended Rectal:  Not performed.  Msk:  MAEW x4, No clubbing or cyanosis. Strength 5/5. Symmetrical without gross deformities. Neurologic:  No focal deficits Skin:  Warm, dry, pink without significant lesions or rashes. Psych:  Alert and cooperative. Normal affect.  LAB RESULTS: Recent Labs    05/17/21 1432 05/17/21 2153  WBC 7.7 7.7  HGB 13.5 13.0  HCT 40.2 39.1  PLT 272 255   BMET Recent Labs    05/17/21 1432 05/17/21 2153  NA 138 134*  K 3.4* 2.5*  CL 90* 89*  CO2 31* 35*  GLUCOSE 87 113*  BUN 23 28*  CREATININE 1.70* 1.68*  CALCIUM 10.1 9.6   LFT Recent Labs    05/17/21 2153  PROT 7.7  ALBUMIN 4.6  AST 39  ALT 39  ALKPHOS 120  BILITOT 0.8   PT/INR No results for input(s): LABPROT, INR in the last 72 hours.  STUDIES: DG Abdomen 1 View  Result Date: 05/18/2021 CLINICAL DATA:  Constipation.  Vomiting.  Dizziness. EXAM: ABDOMEN - 1 VIEW; ABDOMEN - 2 VIEW COMPARISON:  None. FINDINGS: Nonobstructive bowel gas pattern. Nonspecific air-fluid level within the right mid abdomen. Stool throughout the descending sigmoid colon. Round punctate density overlying the pelvis likely represent phleboliths. There is an oval approximately a 0.8 cm calcific density overlying the right renal shadow that may represent nephrolithiasis. Right upper quadrant surgical clips. Visualized lung bases grossly unremarkable. IMPRESSION: 1. Nonobstructive bowel gas pattern with stool throughout the descending and sigmoid colon. 2. Oval 0.8 cm calcific density overlying the right renal shadow may represent nephrolithiasis. Electronically Signed   By: Iven Finn M.D.   On: 05/18/2021 02:19   CT ABDOMEN PELVIS W CONTRAST  Result Date: 05/18/2021 CLINICAL DATA:  Nausea  and vomiting. Worsening constipation. Possible obstruction. EXAM: CT ABDOMEN AND PELVIS WITH CONTRAST TECHNIQUE: Multidetector CT imaging of the abdomen and pelvis was performed using the standard protocol following bolus administration of intravenous contrast. CONTRAST:  40mL OMNIPAQUE IOHEXOL 300 MG/ML  SOLN COMPARISON:  None. FINDINGS: Lower chest: The lung bases are clear. No pleural effusion or pulmonary lesions. The heart is normal in size. No pericardial effusion. There is a moderate to large hiatal hernia. Hepatobiliary: Simple left hepatic lobe cyst. No worrisome hepatic lesions or intrahepatic biliary dilatation. The gallbladder is surgically absent. Mild associated common bile duct dilatation measuring 16 mm in the porta hepatis and 9 mm in the head of the pancreas. Pancreas: No mass, inflammation or ductal dilatation. Spleen: Normal size.  No focal lesions. Adrenals/Urinary Tract: The adrenal glands and kidneys are. No renal lesions or hydronephrosis. Bladder is decompressed but grossly normal. Stomach/Bowel: The stomach, duodenum and small are. No inflammatory changes, mass lesions or obstructive findings. The terminal ileum is normal. The appendix is normal. The colon is moderately distended with fluid and stool all the way down to the rectosigmoid junction. The rectum is relatively decompressed. No findings for acute inflammatory process, mass lesion or obstruction. Patient may require sigmoidoscopy to exclude narrowing or stricture at the rectosigmoid junction. Vascular/Lymphatic: Age advanced atherosclerotic calcifications involving the aorta and iliac arteries but no aneurysm. The branch vessels are patent. The major venous structures are patent. No mesenteric or retroperitoneal mass or adenopathy. Reproductive: Surgically absent. Other: No pelvic mass or adenopathy. No free pelvic fluid collections. No inguinal mass or adenopathy. No abdominal wall hernia or subcutaneous lesions. Musculoskeletal:  No significant bony findings. IMPRESSION: 1. Moderate distention of the colon with fluid and stool all the way down to the rectosigmoid junction. The rectum is relatively decompressed. No findings for acute inflammatory process, mass lesion or obstruction. Patient may require sigmoidoscopy to exclude narrowing or stricture at the rectosigmoid junction. 2. Status post cholecystectomy with mild associated common bile duct dilatation. 3. Moderate to large hiatal hernia. 4. Age advanced atherosclerotic calcifications involving the aorta and iliac arteries. 5. Aortic atherosclerosis. Aortic Atherosclerosis (ICD10-I70.0).  Is a Chiropractor Signed   By: Marijo Sanes M.D.   On: 05/18/2021 07:51   DG Abd 2 Views  Result Date: 05/18/2021 CLINICAL DATA:  Constipation.  Vomiting.  Dizziness. EXAM: ABDOMEN - 1 VIEW; ABDOMEN - 2 VIEW COMPARISON:  None. FINDINGS: Nonobstructive bowel gas pattern. Nonspecific air-fluid level within the right mid abdomen. Stool throughout the descending sigmoid colon. Round punctate density overlying the pelvis likely represent phleboliths. There is an oval approximately a 0.8 cm calcific density overlying the right renal shadow that may represent nephrolithiasis. Right upper quadrant surgical clips. Visualized lung bases grossly unremarkable. IMPRESSION: 1. Nonobstructive bowel gas pattern with stool throughout the descending and sigmoid colon. 2. Oval 0.8 cm calcific density overlying the right renal shadow may represent nephrolithiasis. Electronically Signed   By: Iven Finn M.D.   On: 05/18/2021 02:19       Impression / Plan:   60 y/o lady with history of constipation who presents with severe constipation  - IV fluids - replace electrolytes aggressively - clear liquid diet (ordered) - recommend golytely at the bedside for her to slowly sip. Should stop if has severe nausea - regular tap water enemas - plan is to get her bowel movements and then follow-up with her  primary GI doctor to start a maintenance regimen and then can plan on colonoscopy evaluation  Will continue to follow, please call if any questions or concerns.  Raylene Miyamoto MD, MPH [place]

## 2021-05-18 NOTE — ED Notes (Addendum)
ED Provider at bedside accompanied with this RN.

## 2021-05-18 NOTE — ED Notes (Signed)
Report given to Jennifer, RN

## 2021-05-18 NOTE — H&P (Addendum)
History and Physical   Anna Cruz UEA:[id number] DOB: [birth year] DOA: 05/18/2021  PCP: [facility]  Outpatient Specialists: Dr. Raul Del (pulmonology) and Dr. Vicente Males (gastroenterology) Patient coming from: Home  I have personally briefly reviewed patient's old medical records in [place].  Chief Concern: Abdominal pain  HPI: Anna Cruz is a 60 y.o. female with medical history significant for hyperlipidemia, hypertension, depression/anxiety, tobacco abuse, GERD, COPD, constipation, history of alcohol use, presents to the emergency department for chief concerns of abdominal pain.  She reports that her abdominal pain has been ongoing for 3 weeks. she endorses subjective fever at home with T-max of 99.3. She denies chest pain, shortness of breath, constipation for 2 weeks.  She reports she has not had a bowel movement for 2 weeks.  She reports that the longest she is ever gone without having a bowel movement was about 3 days.  She endorses nausea and vomiting, 4-5 x clear white plegm.  She reports that she has tried multiple stool softeners over the course of the last 2 weeks without any improvements.  She endorses 45 pound weight gain in 6 months.  She states that her last EtOH drink was Apr 12, 2020.  Currently she is staying at a female residential home for recovering addicts, [facility].   She endorses bright red blood after enema.  She reports that she has not had a bowel movement and therefore does not know if she is still bleeding.  She wears 3 L [place] as needed.  Social history: Formerly drink 2 bottles of Shearon Stalls. She is a tobacco user, and 2 cigs to 8 cigarettes. She denies IV drugs. She used to live her daughter. She is disabled and formerly worked as Freight forwarder at [employer]. She is disabled  Vaccination history: she is not vaccinated for covid 19   ROS: Constitutional: + weight change, gain, , no fever ENT/Mouth: no sore throat, no  rhinorrhea Eyes: no eye pain, no vision changes Cardiovascular: no chest pain, no dyspnea,  no edema, no palpitations Respiratory: no cough, no sputum, no wheezing Gastrointestinal: + nausea, + vomiting, no diarrhea, + constipation Genitourinary: no urinary incontinence, no dysuria, no hematuria Musculoskeletal: no arthralgias, no myalgias Skin: no skin lesions, no pruritus, Neuro: + weakness, no loss of consciousness, no syncope Psych: no anxiety, no depression, + decrease appetite Heme/Lymph: no bruising, no bleeding  ED Course: Discussed with ED provider, patient requiring hospitalization for abdominal pain.  Vitals in the emergency department was remarkable for temperature of 98, respiration rate of 18, heart rate of 87, blood pressure 100/76, SPO2 of 97% on 3 L nasal cannula.  Labs in the emergency department was remarkable for  Assessment/Plan  Principal Problem:   Abdominal pain Active Problems:   AKI (acute kidney injury) ([place])   Constipation   Hyperlipidemia   Tobacco abuse   COPD with chronic bronchitis (HCC)   Hypokalemia   Abdominal pain-in setting of chronic constipation - No bowel movement for 2 to 3 weeks - GI consulted, appreciate further recommendations: Water enema regular, GoLytely at bedside for patient to sit, with instructions to stop if patient develops severe nausea -GoLytely with instructions for patient sits up at bedside ordered - Regular tapwater enema ordered - Clear liquids - Discussed with nursing staff that patient is not solid food until she has a bowel movement and continue clear liquids pending bowel movements  Acute kidney injury-improving, suspect secondary to poor p.o. intake in setting of abdominal pain from  constipation - LR IVF at 125 cc/h for 1 day ordered - BMP in the a.m.  Hypokalemia-present on admission, status post 80 mEq potassium chloride p.o. per EDP - On recheck her potassium is 3.0 - IV potassium chloride 10 mEq IV  administer over 60 minutes, 5 doses ordered - BMP in the a.m.  Hypertension-hydrochlorothiazide 25 mg daily resumed  Hyperlipidemia atorvastatin 20 mg daily resumed  Depression/anxiety-Celexa 20 mg daily, fluoxetine 40 mg twice daily resumed  Insomnia-quetiapine 600 mg p.o. nightly, trazodone 200 mg daily nightly, zolpidem 10 mg nightly  History of alcohol abuse-patient has not had an alcoholic beverage since May 2021 - Previously she used to have 2 bottle of Shearon Stalls daily, 1 outside in the living area and one hidden in her bedroom so that her daughter does not think she drinks too much alcohol -I applauded patient for her accomplishment of being sober for over a year  Chart reviewed.   DVT prophylaxis: Enoxaparin 40 mg subcutaneous nightly Code Status: Full code Diet: Clear liquids Family Communication: No Disposition Plan: Pending clinical course Consults called: Gastroenterology Admission status: MedSurg, observation, no telemetry  Past Medical History:  Diagnosis Date   Asthma    Cancer ([place])    COPD (chronic obstructive pulmonary disease) ([place])    Hypertension    Past Surgical History:  Procedure Laterality Date   cesection     CHOLECYSTECTOMY     Social History:  reports that she has been smoking cigarettes. She has been smoking an average of 0.50 packs per day. She has never used smokeless tobacco. She reports previous alcohol use. No history on file for drug use.  No Known Allergies  Family history: Family history reviewed and not pertinent  Prior to Admission medications   Medication Sig Start Date End Date Taking? Authorizing Provider  albuterol (PROVENTIL) (2.5 MG/3ML) 0.083% nebulizer solution Take 2.5 mg by nebulization every 4 (four) hours as needed for wheezing or shortness of breath.   Yes [provider]  albuterol (VENTOLIN HFA) 108 (90 Base) MCG/ACT inhaler Inhale 2 puffs into the lungs every 4 (four) hours as needed for wheezing or  shortness of breath.   Yes [provider]  Ascorbic Acid (VITAMIN C) 1000 MG tablet Take 1,000 mg by mouth daily.   Yes [provider]  atorvastatin (LIPITOR) 20 MG tablet Take 20 mg by mouth daily.   Yes [provider]  citalopram (CELEXA) 20 MG tablet Take 20 mg by mouth daily.   Yes [provider]  cyanocobalamin (,VITAMIN B-12,) 1000 MCG/ML injection Inject 1,000 mcg into the muscle every 30 (thirty) days.   Yes [provider]  docusate sodium (COLACE) 100 MG capsule Take 200 mg by mouth daily.   Yes [provider]  ferrous sulfate 325 (65 FE) MG tablet Take 325 mg by mouth 3 (three) times daily with meals.   Yes [provider]  FLUoxetine (PROZAC) 40 MG capsule Take 40 mg by mouth 2 (two) times daily. 04/17/21  Yes [provider]  fluticasone (FLONASE) 50 MCG/ACT nasal spray Place 1 spray into both nostrils daily. 02/14/21  Yes [provider]  fluticasone-salmeterol (ADVAIR) 250-50 MCG/ACT AEPB Inhale 1 puff into the lungs in the morning and at bedtime. 04/24/21  Yes [provider]  folic acid (FOLVITE) 1 MG tablet Take 1 mg by mouth daily.   Yes [provider]  furosemide (LASIX) 20 MG tablet Take 20 mg by mouth daily.   Yes [provider]  hydrochlorothiazide (HYDRODIURIL) 25 MG tablet Take 25 mg by mouth daily.   Yes [provider]  hydrOXYzine (ATARAX/VISTARIL) 50 MG tablet Take 100 mg by mouth every 8 (eight) hours as needed for itching or anxiety.   Yes [provider]  linaclotide (LINZESS) 145 MCG CAPS capsule Take 145 mcg by mouth daily.   Yes [provider]  loratadine (CLARITIN) 10 MG tablet Take 10 mg by mouth daily.   Yes [provider]  montelukast (SINGULAIR) 10 MG tablet Take 10 mg by mouth daily. 12/12/20  Yes [provider]  Multiple Vitamin (MULTIVITAMIN) tablet Take 1 tablet by mouth daily.   Yes [provider]  nicotine (NICODERM CQ - DOSED IN MG/24 HOURS) 21 mg/24hr patch Place 21 mg onto the skin daily.   Yes [provider]  ondansetron (ZOFRAN) 8 MG tablet Take 8 mg by mouth every 8 (eight) hours as needed for nausea.   Yes [provider]  pantoprazole (PROTONIX) 40 MG tablet Take 40 mg by mouth daily.   Yes [provider]  Potassium Chloride ER 20 MEQ TBCR Take 20 mEq by mouth daily.   Yes [provider]  prazosin (MINIPRESS) 1 MG capsule Take 2 mg by mouth at bedtime.   Yes [provider]  QUEtiapine (SEROQUEL) 300 MG tablet Take 600 mg by mouth at bedtime.   Yes [provider]  tiotropium (SPIRIVA) 18 MCG inhalation capsule Place 18 mcg into inhaler and inhale daily.   Yes [provider]  traZODone (DESYREL) 100 MG tablet Take 200 mg by mouth at bedtime.   Yes [provider]  zolpidem (AMBIEN) 10 MG tablet Take 10 mg by mouth at bedtime.   Yes [provider]  polyethylene glycol-electrolytes (NULYTELY) 420 g solution Drink one 8 oz glass of mixture every 15 minutes until you finish all of the jug or until you feel empty. 05/17/21   Jonathon Bellows, MD   Physical Exam: Vitals:   05/18/21 0945 05/18/21 1000 05/18/21 1300 05/18/21 1704  BP:  128/78 (!) 109/57 123/80  Pulse: 70 78 72 75  Resp:   18 18  Temp:    97.7 F (36.5 C)  TempSrc:    Oral  SpO2: 99% 98% 95% 100%  Weight:      Height:       Constitutional: appears age-appropriate, NAD, calm, comfortable Eyes: PERRL, lids and conjunctivae normal ENMT: Mucous membranes are moist. Posterior pharynx clear of any exudate or lesions. Age-appropriate dentition. Hearing appropriate Neck: normal, supple, no masses, no thyromegaly Respiratory: clear to auscultation bilaterally, no wheezing, no crackles. Normal respiratory effort. No accessory muscle use.  Cardiovascular: Regular rate and rhythm, no murmurs / rubs / gallops. No extremity edema. 2+  pedal pulses. No carotid bruits.  Abdomen: + LLQ tenderness, no masses palpated, no hepatosplenomegaly. NO bowel sounds auscultated.  Musculoskeletal: no clubbing / cyanosis. No joint deformity upper and lower extremities. Good ROM, no contractures, no atrophy. Normal muscle tone.  Skin: no rashes, lesions, ulcers. No induration Neurologic: Sensation intact. Strength 5/5 in all 4.  Psychiatric: Normal judgment and insight. Alert and oriented x 3. Normal mood.   EKG: Not indicated  Imaging on Admission: I personally reviewed and I agree with radiologist reading as below.  DG Abdomen 1 View  Result Date: 05/18/2021 CLINICAL DATA:  Constipation.  Vomiting.  Dizziness. EXAM: ABDOMEN - 1 VIEW; ABDOMEN - 2 VIEW COMPARISON:  None. FINDINGS: Nonobstructive bowel gas  pattern. Nonspecific air-fluid level within the right mid abdomen. Stool throughout the descending sigmoid colon. Round punctate density overlying the pelvis likely represent phleboliths. There is an oval approximately a 0.8 cm calcific density overlying the right renal shadow that may represent nephrolithiasis. Right upper quadrant surgical clips. Visualized lung bases grossly unremarkable. IMPRESSION: 1. Nonobstructive bowel gas pattern with stool throughout the descending and sigmoid colon. 2. Oval 0.8 cm calcific density overlying the right renal shadow may represent nephrolithiasis. Electronically Signed   By: Iven Finn M.D.   On: 05/18/2021 02:19   CT ABDOMEN PELVIS W CONTRAST  Result Date: 05/18/2021 CLINICAL DATA:  Nausea and vomiting. Worsening constipation. Possible obstruction. EXAM: CT ABDOMEN AND PELVIS WITH CONTRAST TECHNIQUE: Multidetector CT imaging of the abdomen and pelvis was performed using the standard protocol following bolus administration of intravenous contrast. CONTRAST:  78mL OMNIPAQUE IOHEXOL 300 MG/ML  SOLN COMPARISON:  None. FINDINGS: Lower chest: The lung bases are clear. No pleural effusion or pulmonary  lesions. The heart is normal in size. No pericardial effusion. There is a moderate to large hiatal hernia. Hepatobiliary: Simple left hepatic lobe cyst. No worrisome hepatic lesions or intrahepatic biliary dilatation. The gallbladder is surgically absent. Mild associated common bile duct dilatation measuring 16 mm in the porta hepatis and 9 mm in the head of the pancreas. Pancreas: No mass, inflammation or ductal dilatation. Spleen: Normal size.  No focal lesions. Adrenals/Urinary Tract: The adrenal glands and kidneys are. No renal lesions or hydronephrosis. Bladder is decompressed but grossly normal. Stomach/Bowel: The stomach, duodenum and small are. No inflammatory changes, mass lesions or obstructive findings. The terminal ileum is normal. The appendix is normal. The colon is moderately distended with fluid and stool all the way down to the rectosigmoid junction. The rectum is relatively decompressed. No findings for acute inflammatory process, mass lesion or obstruction. Patient may require sigmoidoscopy to exclude narrowing or stricture at the rectosigmoid junction. Vascular/Lymphatic: Age advanced atherosclerotic calcifications involving the aorta and iliac arteries but no aneurysm. The branch vessels are patent. The major venous structures are patent. No mesenteric or retroperitoneal mass or adenopathy. Reproductive: Surgically absent. Other: No pelvic mass or adenopathy. No free pelvic fluid collections. No inguinal mass or adenopathy. No abdominal wall hernia or subcutaneous lesions. Musculoskeletal: No significant bony findings. IMPRESSION: 1. Moderate distention of the colon with fluid and stool all the way down to the rectosigmoid junction. The rectum is relatively decompressed. No findings for acute inflammatory process, mass lesion or obstruction. Patient may require sigmoidoscopy to exclude narrowing or stricture at the rectosigmoid junction. 2. Status post cholecystectomy with mild associated common  bile duct dilatation. 3. Moderate to large hiatal hernia. 4. Age advanced atherosclerotic calcifications involving the aorta and iliac arteries. 5. Aortic atherosclerosis. Aortic Atherosclerosis (ICD10-I70.0).  Is a Chiropractor Signed   By: Marijo Sanes M.D.   On: 05/18/2021 07:51   DG Abd 2 Views  Result Date: 05/18/2021 CLINICAL DATA:  Constipation.  Vomiting.  Dizziness. EXAM: ABDOMEN - 1 VIEW; ABDOMEN - 2 VIEW COMPARISON:  None. FINDINGS: Nonobstructive bowel gas pattern. Nonspecific air-fluid level within the right mid abdomen. Stool throughout the descending sigmoid colon. Round punctate density overlying the pelvis likely represent phleboliths. There is an oval approximately a 0.8 cm calcific density overlying the right renal shadow that may represent nephrolithiasis. Right upper quadrant surgical clips. Visualized lung bases grossly unremarkable. IMPRESSION: 1. Nonobstructive bowel gas pattern with stool throughout the descending and sigmoid colon. 2. Oval 0.8 cm calcific  density overlying the right renal shadow may represent nephrolithiasis. Electronically Signed   By: Iven Finn M.D.   On: 05/18/2021 02:19    Labs on Admission: I have personally reviewed following labs  CBC: Recent Labs  Lab 05/17/21 1432 05/17/21 2153  WBC 7.7 7.7  NEUTROABS 4.5  --   HGB 13.5 13.0  HCT 40.2 39.1  MCV 80 79.8*  PLT 272 256   Basic Metabolic Panel: Recent Labs  Lab 05/17/21 1432 05/17/21 2153 05/18/21 1544  NA 138 134* 134*  K 3.4* 2.5* 3.0*  CL 90* 89* 93*  CO2 31* 35* 33*  GLUCOSE 87 113* 97  BUN 23 28* 23*  CREATININE 1.70* 1.68* 1.13*  CALCIUM 10.1 9.6 8.9   GFR: Estimated Creatinine Clearance: 49.6 mL/min (A) (by C-G formula based on SCr of 1.13 mg/dL (H)).  Liver Function Tests: Recent Labs  Lab 05/17/21 1432 05/17/21 2153  AST 35 39  ALT 38* 39  ALKPHOS 153* 120  BILITOT 0.4 0.8  PROT 7.2 7.7  ALBUMIN 5.0* 4.6   Recent Labs  Lab 05/17/21 2153  LIPASE  35   Urine analysis:    Component Value Date/Time   COLORURINE YELLOW (A) 05/17/2021 2144   APPEARANCEUR CLEAR (A) 05/17/2021 2144   LABSPEC 1.013 05/17/2021 2144   PHURINE 7.0 05/17/2021 2144   GLUCOSEU NEGATIVE 05/17/2021 2144   HGBUR NEGATIVE 05/17/2021 2144   BILIRUBINUR NEGATIVE 05/17/2021 2144   KETONESUR NEGATIVE 05/17/2021 2144   PROTEINUR NEGATIVE 05/17/2021 2144   NITRITE NEGATIVE 05/17/2021 2144   LEUKOCYTESUR NEGATIVE 05/17/2021 2144   Dr. Tobie Poet [facility]  If 7PM-7AM, please contact overnight-coverage provider If 7AM-7PM, please contact day coverage provider [email or web address]  05/18/2021, 6:04 PM

## 2021-05-18 NOTE — ED Notes (Signed)
Pt back from CT

## 2021-05-18 NOTE — Telephone Encounter (Signed)
-----   Message from Jonathon Bellows, MD sent at 05/18/2021  8:25 AM EDT ----- Inform   X ray shows lots of stool left side of colon . Should start clearing with golytely and fleets enema suggested yesterday .   Once she has cleaned out commence on Trulance daily , can give 2 weeks samples and tell her to call for script if it works

## 2021-05-18 NOTE — ED Provider Notes (Signed)
[facility] Emergency Department Provider Note  ____________________________________________   Event Date/Time   First MD Initiated Contact with Patient 05/18/21 0012     (approximate)  I have reviewed the triage vital signs and the nursing notes.   HISTORY  Chief Complaint Constipation    HPI Anna Cruz is a 60 y.o. female   whose medical history is most notable for COPD on 2 L of oxygen as needed.  She presents tonight for at least 2 weeks of gradually worsening constipation.  As of the last day or 2 she is also had some nausea and vomiting as well as generalized aching abdominal pain cramping.  She has had some minor issues with constipation in the past but nothing this bad.  She has not recently started any new medications and does not take chronic pain medicine.  She has been told that her potassium is low when she takes a potassium supplement.  She said that she has tried multiple over-the-counter laxatives and stool softeners including MiraLAX, Colace, Ex-Lax, and a bottle of what sounds like magnesium citrate, but nothing has helped.  She describes the symptoms as severe by tonight to go makes it better or worse.        Past Medical History:  Diagnosis Date   Asthma    Cancer ([place])    COPD (chronic obstructive pulmonary disease) ([place])    Hypertension     There are no problems to display for this patient.   Past Surgical History:  Procedure Laterality Date   cesection     CHOLECYSTECTOMY      Prior to Admission medications   Medication Sig Start Date End Date Taking? Authorizing Provider  Cyanocobalamin 1000 MCG CAPS SMARTSIG:By Mouth 05/16/21   [provider]  polyethylene glycol-electrolytes (NULYTELY) 420 g solution Drink one 8 oz glass of mixture every 15 minutes until you finish all of the jug or until you feel empty. 05/17/21   Jonathon Bellows, MD    Allergies Patient has no known allergies.  History reviewed. No  pertinent family history.  Social History Social History   Tobacco Use   Smoking status: Every Day    Packs/day: 0.50    Pack years: 0.00    Types: Cigarettes   Smokeless tobacco: Never  Substance Use Topics   Alcohol use: Not Currently    Review of Systems Constitutional: No fever/chills Eyes: No visual changes. ENT: No sore throat. Cardiovascular: Denies chest pain. Respiratory: Denies shortness of breath. Gastrointestinal: Gradually worsening constant patient with abdominal distention, generalized abdominal pain, and as of the last day or so, nausea and vomiting. Genitourinary: Negative for dysuria. Musculoskeletal: Negative for neck pain.  Negative for back pain. Integumentary: Negative for rash. Neurological: Negative for headaches, focal weakness or numbness.   ____________________________________________   PHYSICAL EXAM:  VITAL SIGNS: ED Triage Vitals  Enc Vitals Group     BP 05/17/21 2140 100/76     Pulse Rate 05/17/21 2139 87     Resp 05/17/21 2140 18     Temp 05/17/21 2139 98 F (36.7 C)     Temp Source 05/17/21 2139 Oral     SpO2 05/17/21 2140 97 %     Weight 05/17/21 2140 71 kg (156 lb 8.4 oz)     Height 05/18/21 0138 1.549 m (5\' 1" )     Head Circumference --      Peak Flow --      Pain Score --  Pain Loc --      Pain Edu? --      Excl. in [place]? --     Constitutional: Alert and oriented.  Eyes: Conjunctivae are normal.  Head: Atraumatic. Nose: No congestion/rhinnorhea. Mouth/Throat: Patient is wearing a mask. Neck: No stridor.  No meningeal signs.   Cardiovascular: Normal rate, regular rhythm. Good peripheral circulation. Respiratory: Normal respiratory effort.  No retractions. Gastrointestinal: Obese.  Questionable abdominal distention, no significant tenderness palpation to suggest peritonitis but she is mildly tender throughout.  Patient deferred rectal exam and digital disimpaction at this time. Musculoskeletal: No lower extremity  tenderness nor edema. No gross deformities of extremities. Neurologic:  Normal speech and language. No gross focal neurologic deficits are appreciated.  Skin:  Skin is warm, dry and intact. Psychiatric: Mood and affect are normal. Speech and behavior are normal.  ____________________________________________   LABS (all labs ordered are listed, but only abnormal results are displayed)  Labs Reviewed  COMPREHENSIVE METABOLIC PANEL - Abnormal; Notable for the following components:      Result Value   Sodium 134 (*)    Potassium 2.5 (*)    Chloride 89 (*)    CO2 35 (*)    Glucose, Bld 113 (*)    BUN 28 (*)    Creatinine, Ser 1.68 (*)    GFR, Estimated 35 (*)    All other components within normal limits  CBC - Abnormal; Notable for the following components:   MCV 79.8 (*)    All other components within normal limits  URINALYSIS, COMPLETE (UACMP) WITH MICROSCOPIC - Abnormal; Notable for the following components:   Color, Urine YELLOW (*)    APPearance CLEAR (*)    All other components within normal limits  LIPASE, BLOOD   ____________________________________________   RADIOLOGY I, Hinda Kehr, personally viewed and evaluated these images (plain radiographs) as part of my medical decision making, as well as reviewing the written report by the radiologist.  ED MD interpretation: No acute abnormalities identified on 1 view abdomen although the patient has a large stool burden.  Official radiology report(s): DG Abdomen 1 View  Result Date: 05/18/2021 CLINICAL DATA:  Constipation.  Vomiting.  Dizziness. EXAM: ABDOMEN - 1 VIEW; ABDOMEN - 2 VIEW COMPARISON:  None. FINDINGS: Nonobstructive bowel gas pattern. Nonspecific air-fluid level within the right mid abdomen. Stool throughout the descending sigmoid colon. Round punctate density overlying the pelvis likely represent phleboliths. There is an oval approximately a 0.8 cm calcific density overlying the right renal shadow that may  represent nephrolithiasis. Right upper quadrant surgical clips. Visualized lung bases grossly unremarkable. IMPRESSION: 1. Nonobstructive bowel gas pattern with stool throughout the descending and sigmoid colon. 2. Oval 0.8 cm calcific density overlying the right renal shadow may represent nephrolithiasis. Electronically Signed   By: Iven Finn M.D.   On: 05/18/2021 02:19    ____________________________________________   PROCEDURES   Procedure(s) performed (including Critical Care):  Procedures   ____________________________________________   INITIAL IMPRESSION / MDM / [place] / ED COURSE  As part of my medical decision making, I reviewed the following data within the [facility] notes reviewed and incorporated, Labs reviewed , EKG interpreted , Old chart reviewed, Patient signed out to , Notes from prior ED visits, and [place] Controlled Substance Database   Differential diagnosis includes, but is not limited to, nonspecific constipation, constipation secondary to hypokalemia, obstruction, medication or drug side effect, obstructive neoplasm.  Patient symptoms have been gradually worsening over extended  period of time and seem to have gotten to the point that now they are causing some nausea and vomiting.  She has mild and diffuse tenderness to palpation but no peritonitis.  Vital signs are stable and within normal limits.  Lab work is notable for potassium of 2.5 which is likely related directly to the constipation.  Creatinine is up slightly at 1.67 likely due to decreased oral intake in the setting of her constipation and now with some nausea and vomiting.  Rest of her labs are unremarkable.  I talked with the patient about digital disimpaction and she does not want to do that at this time.  We talked about additional options and we decided to get a 1 view abdominal x-ray and try an enema with added liquid colace and mag citrate, as well as taking  mag citrate (1/2 bottle) and lactulose 20 g PO.  We will then reassess.     Clinical Course as of 05/18/21 0836  Fri May 18, 2021  0156 Also giving potassium 40 mill equivalents by mouth [CF]  0236 I personally reviewed the patient's imaging and agree with the radiologist's interpretation that there are no acute abnormalities identified on the abdominal x-ray, but there is an extensive stool burden. [CF]  0255 Patient tolerated about half of the bag of enema and is currently sitting on the bedside commode. [CF]  6283 Patient has had minimal success and spite of the oral medications and the 2 attempts at enemas.  After she discusses some more with the nurse that decided that she would be willing to try digital disimpaction.  However multiple critical patients in the department have continued to delay the  ability of both me and her nurse to go ahead and do the procedure.  I will try additional disimpaction when we both have the opportunity.  [CF]  0654 No success on attempted fecal disimpaction.  There is no palpable stool ball in the rectum and nothing that I can reach with my finger.  There was some gross blood but this could have been secondary to the trauma from the enema.  Patient tolerated the procedure well and said it was uncomfortable but not painful.  Given the fact that the patient has not had any success in spite of aggressive oral laxatives and an enema, I will proceed with CT scan of the abdomen and pelvis with IV contrast since she is still feeling a little bit nauseated.  I am concerned that it is possible she could have either a neoplasm that is causing an obstruction that has been gradually worsening over the last couple of weeks, or a more classic SBO/ileus.  I ordered 1 L lactated Ringer's IV fluid bolus given her acute kidney injury, as well as another 40 mill equivalents of oral potassium.  She has already received 2 g of IV magnesium.  She understands the plan and understands that  I am transferring care to Dr. Jari Pigg to follow-up on the CT scan and reassess. [CF]    Clinical Course User Index [CF] Hinda Kehr, MD     ____________________________________________  FINAL CLINICAL IMPRESSION(S) / ED DIAGNOSES  Final diagnoses:  Obstipation  Acute kidney injury ([place])     MEDICATIONS GIVEN DURING THIS VISIT:  Medications  magnesium sulfate IVPB 2 g 50 mL (2 g Intravenous New Bag/Given 05/18/21 0752)  lactulose (CHRONULAC) 10 GM/15ML solution 20 g (20 g Oral Given 05/18/21 0137)  potassium chloride SA (KLOR-CON) CR tablet 40 mEq (40 mEq Oral  Given 05/18/21 0133)  magnesium citrate solution 0.5 Bottle (0.5 Bottles Oral Given 05/18/21 0134)  docusate (COLACE) 50 MG/5ML liquid 100 mg (100 mg Per Tube Given 05/18/21 0137)  lidocaine (XYLOCAINE) 2 % viscous mouth solution 15 mL (15 mLs Mouth/Throat Given by Other 05/18/21 9728)  lactated ringers bolus 1,000 mL (1,000 mLs Intravenous New Bag/Given 05/18/21 0750)  potassium chloride SA (KLOR-CON) CR tablet 40 mEq (40 mEq Oral Given 05/18/21 0748)  iohexol (OMNIPAQUE) 300 MG/ML solution 75 mL (75 mLs Intravenous Contrast Given 05/18/21 2060)     ED Discharge Orders     None        Note:  This document was prepared using Dragon voice recognition software and may include unintentional dictation errors.   Hinda Kehr, MD 05/18/21 [phone]

## 2021-05-19 DIAGNOSIS — K449 Diaphragmatic hernia without obstruction or gangrene: Secondary | ICD-10-CM | POA: Diagnosis present

## 2021-05-19 DIAGNOSIS — I700 Atherosclerosis of aorta: Secondary | ICD-10-CM | POA: Diagnosis present

## 2021-05-19 DIAGNOSIS — E669 Obesity, unspecified: Secondary | ICD-10-CM | POA: Diagnosis present

## 2021-05-19 DIAGNOSIS — F17210 Nicotine dependence, cigarettes, uncomplicated: Secondary | ICD-10-CM | POA: Diagnosis present

## 2021-05-19 DIAGNOSIS — K219 Gastro-esophageal reflux disease without esophagitis: Secondary | ICD-10-CM | POA: Diagnosis present

## 2021-05-19 DIAGNOSIS — F32A Depression, unspecified: Secondary | ICD-10-CM | POA: Diagnosis present

## 2021-05-19 DIAGNOSIS — J9611 Chronic respiratory failure with hypoxia: Secondary | ICD-10-CM | POA: Diagnosis present

## 2021-05-19 DIAGNOSIS — J449 Chronic obstructive pulmonary disease, unspecified: Secondary | ICD-10-CM | POA: Diagnosis present

## 2021-05-19 DIAGNOSIS — K5900 Constipation, unspecified: Secondary | ICD-10-CM | POA: Diagnosis present

## 2021-05-19 DIAGNOSIS — Z79899 Other long term (current) drug therapy: Secondary | ICD-10-CM | POA: Diagnosis not present

## 2021-05-19 DIAGNOSIS — Z28310 Unvaccinated for covid-19: Secondary | ICD-10-CM | POA: Diagnosis not present

## 2021-05-19 DIAGNOSIS — K5909 Other constipation: Secondary | ICD-10-CM | POA: Diagnosis present

## 2021-05-19 DIAGNOSIS — I10 Essential (primary) hypertension: Secondary | ICD-10-CM | POA: Diagnosis present

## 2021-05-19 DIAGNOSIS — F419 Anxiety disorder, unspecified: Secondary | ICD-10-CM | POA: Diagnosis present

## 2021-05-19 DIAGNOSIS — N179 Acute kidney failure, unspecified: Secondary | ICD-10-CM | POA: Diagnosis present

## 2021-05-19 DIAGNOSIS — K635 Polyp of colon: Secondary | ICD-10-CM | POA: Diagnosis present

## 2021-05-19 DIAGNOSIS — E785 Hyperlipidemia, unspecified: Secondary | ICD-10-CM | POA: Diagnosis present

## 2021-05-19 DIAGNOSIS — R109 Unspecified abdominal pain: Secondary | ICD-10-CM | POA: Diagnosis present

## 2021-05-19 DIAGNOSIS — G4700 Insomnia, unspecified: Secondary | ICD-10-CM | POA: Diagnosis present

## 2021-05-19 DIAGNOSIS — Z9049 Acquired absence of other specified parts of digestive tract: Secondary | ICD-10-CM | POA: Diagnosis not present

## 2021-05-19 DIAGNOSIS — R1084 Generalized abdominal pain: Secondary | ICD-10-CM

## 2021-05-19 DIAGNOSIS — J90 Pleural effusion, not elsewhere classified: Secondary | ICD-10-CM | POA: Diagnosis present

## 2021-05-19 DIAGNOSIS — Z20822 Contact with and (suspected) exposure to covid-19: Secondary | ICD-10-CM | POA: Diagnosis present

## 2021-05-19 DIAGNOSIS — Z6830 Body mass index (BMI) 30.0-30.9, adult: Secondary | ICD-10-CM | POA: Diagnosis not present

## 2021-05-19 DIAGNOSIS — D509 Iron deficiency anemia, unspecified: Secondary | ICD-10-CM | POA: Diagnosis present

## 2021-05-19 DIAGNOSIS — E876 Hypokalemia: Secondary | ICD-10-CM | POA: Diagnosis present

## 2021-05-19 LAB — CBC
HCT: 31.3 % — ABNORMAL LOW (ref 36.0–46.0)
Hemoglobin: 10.5 g/dL — ABNORMAL LOW (ref 12.0–15.0)
MCH: 27.2 pg (ref 26.0–34.0)
MCHC: 33.5 g/dL (ref 30.0–36.0)
MCV: 81.1 fL (ref 80.0–100.0)
Platelets: 192 10*3/uL (ref 150–400)
RBC: 3.86 MIL/uL — ABNORMAL LOW (ref 3.87–5.11)
RDW: 12.7 % (ref 11.5–15.5)
WBC: 5.1 10*3/uL (ref 4.0–10.5)
nRBC: 0 % (ref 0.0–0.2)

## 2021-05-19 LAB — BASIC METABOLIC PANEL
Anion gap: 7 (ref 5–15)
BUN: 23 mg/dL — ABNORMAL HIGH (ref 6–20)
CO2: 31 mmol/L (ref 22–32)
Calcium: 8.7 mg/dL — ABNORMAL LOW (ref 8.9–10.3)
Chloride: 97 mmol/L — ABNORMAL LOW (ref 98–111)
Creatinine, Ser: 1.07 mg/dL — ABNORMAL HIGH (ref 0.44–1.00)
GFR, Estimated: >60 mL/min (ref >60)
Glucose, Bld: 80 mg/dL (ref 70–99)
Potassium: 3.7 mmol/L (ref 3.5–5.1)
Sodium: 135 mmol/L (ref 135–145)

## 2021-05-19 MED ORDER — CYANOCOBALAMIN 1000 MCG/ML IJ SOLN
1000.0000 ug | INTRAMUSCULAR | Status: DC
  Administered 2021-05-19: 1000 ug via INTRAMUSCULAR
  Filled 2021-05-19: qty 1

## 2021-05-19 MED ORDER — LORATADINE 10 MG PO TABS
10.0000 mg | ORAL_TABLET | Freq: Every day | ORAL | Status: DC
  Administered 2021-05-19 – 2021-05-23 (×5): 10 mg via ORAL
  Filled 2021-05-19 (×5): qty 1

## 2021-05-19 MED ORDER — MOMETASONE FURO-FORMOTEROL FUM 200-5 MCG/ACT IN AERO
2.0000 | INHALATION_SPRAY | Freq: Two times a day (BID) | RESPIRATORY_TRACT | Status: DC
  Administered 2021-05-19 – 2021-05-23 (×8): 2 via RESPIRATORY_TRACT
  Filled 2021-05-19: qty 8.8

## 2021-05-19 MED ORDER — SODIUM CHLORIDE 0.9 % IV BOLUS
250.0000 mL | Freq: Once | INTRAVENOUS | Status: AC
  Administered 2021-05-19: 250 mL via INTRAVENOUS

## 2021-05-19 MED ORDER — ALBUTEROL SULFATE (2.5 MG/3ML) 0.083% IN NEBU: 2.5000 mg | INHALATION_SOLUTION | RESPIRATORY_TRACT | Status: DC | PRN

## 2021-05-19 MED ORDER — FLUTICASONE PROPIONATE 50 MCG/ACT NA SUSP
1.0000 | Freq: Every day | NASAL | Status: DC
  Administered 2021-05-19 – 2021-05-23 (×5): 1 via NASAL
  Filled 2021-05-19: qty 16

## 2021-05-19 MED ORDER — HYDROXYZINE HCL 25 MG PO TABS
100.0000 mg | ORAL_TABLET | Freq: Three times a day (TID) | ORAL | Status: DC | PRN
  Administered 2021-05-19 – 2021-05-22 (×5): 100 mg via ORAL
  Filled 2021-05-19 (×5): qty 4

## 2021-05-19 MED ORDER — TIOTROPIUM BROMIDE MONOHYDRATE 18 MCG IN CAPS
18.0000 ug | ORAL_CAPSULE | Freq: Every day | RESPIRATORY_TRACT | Status: DC
  Administered 2021-05-19 – 2021-05-23 (×5): 18 ug via RESPIRATORY_TRACT
  Filled 2021-05-19: qty 5

## 2021-05-19 MED ORDER — MONTELUKAST SODIUM 10 MG PO TABS
10.0000 mg | ORAL_TABLET | Freq: Every day | ORAL | Status: DC
  Administered 2021-05-19 – 2021-05-23 (×5): 10 mg via ORAL
  Filled 2021-05-19 (×5): qty 1

## 2021-05-19 MED ORDER — KETOROLAC TROMETHAMINE 15 MG/ML IJ SOLN
15.0000 mg | Freq: Four times a day (QID) | INTRAMUSCULAR | Status: DC | PRN
  Administered 2021-05-19 – 2021-05-23 (×6): 15 mg via INTRAVENOUS
  Filled 2021-05-19 (×6): qty 1

## 2021-05-19 MED ORDER — POTASSIUM CHLORIDE 10 MEQ/100ML IV SOLN
10.0000 meq | Freq: Once | INTRAVENOUS | Status: AC
  Administered 2021-05-19: 10 meq via INTRAVENOUS
  Filled 2021-05-19: qty 100

## 2021-05-19 MED ORDER — ALBUTEROL SULFATE HFA 108 (90 BASE) MCG/ACT IN AERS: 2.0000 | INHALATION_SPRAY | RESPIRATORY_TRACT | Status: DC | PRN

## 2021-05-19 MED ORDER — ALUM & MAG HYDROXIDE-SIMETH 200-200-20 MG/5ML PO SUSP
30.0000 mL | ORAL | Status: DC | PRN
  Administered 2021-05-19 – 2021-05-23 (×5): 30 mL via ORAL
  Filled 2021-05-19 (×5): qty 30

## 2021-05-19 NOTE — Progress Notes (Signed)
PROGRESS NOTE  Anna Cruz BPZ:[id number] DOB: 1961/04/20 DOA: 05/18/2021 PCP: [facility]  HPI/Recap of past 24 hours: Anna Cruz is a 60 y.o. female with medical history significant for HTN, HLD, depression/anxiety, tobacco abuse, GERD, COPD, constipation, history of alcohol abuse, presents to the ED with chief concerns of abdominal pain for the past 3 weeks. Reports no BM for 2 weeks, and that's not normal for her. She endorses nausea and vomiting. Reports that she has tried multiple stool softeners over the course of the last 2 weeks without any improvements. Pt endorses 45 pound weight gain in 6 months. Last EtOH drink was Apr 12, 2020. Currently staying at a female residential home for recovering addicts, [facility]. Pt is on 3L of O2 at baseline.  GI consulted, patient admitted for further management.    Today, patient denies any new complaints, denies any nausea/vomiting, had a small BM, still feels constipated.  Denies any chest pain, worsening shortness of breath.     Assessment/Plan: Principal Problem:   Abdominal pain Active Problems:   AKI (acute kidney injury) ([place])   Constipation   Hyperlipidemia   Tobacco abuse   COPD with chronic bronchitis (HCC)   Hypokalemia   Abdominal pain-in setting of chronic constipation No bowel movement for 2 to 3 weeks GI consulted, appreciate recs, tap water enema, GoLytely, disimpaction GI advanced diet Continue IV fluids   AKI Likely 2/2 poor p.o. intake in the setting of significant constipation Continue IV fluids Daily BMP  Hypokalemia Replace as needed  Hypertension BP soft Hold home hydrochlorothiazide  Iron deficiency anemia Baseline hemoglobin normal, dropped to 10.5 likely 2/2 hemodilution No evidence of bleeding Daily CBC  Hyperlipidemia Continue atorvastatin 20 mg daily  COPD/chronic hypoxic respiratory failure Stable at baseline O2, 3 L, saturating well Continue  inhalers, duo nebs as needed, Singulair  Depression/anxiety Continue Celexa 20 mg daily, fluoxetine 40 mg twice daily, Atarax as needed  Insomnia Continue quetiapine 600 mg p.o. nightly, trazodone 200 mg daily nightly, zolpidem 10 mg nightly   History of alcohol abuse Patient has been sober since May 2021 Previously drank about 2 bottle of Shearon Stalls daily  Tobacco abuse Advised to quit Nicotine patch  Obesity Lifestyle modification advised      Estimated body mass index is 30.42 kg/m as calculated from the following:   Height as of this encounter: 5\' 1"  (1.549 m).   Weight as of this encounter: 73 kg.     Code Status: Full  Family Communication: Daughter at bedside  Disposition Plan: Status is: Observation  The patient remains OBS appropriate and will d/c before 2 midnights.  Dispo: The patient is from: Group home              Anticipated d/c is to: Group home              Patient currently is not medically stable to d/c.   Difficult to place patient No    Consultants: GI  Procedures: None  Antimicrobials: None  DVT prophylaxis: Lovenox   Objective: Vitals:   05/18/21 2141 05/19/21 0513 05/19/21 1123 05/19/21 1124  BP: 106/64 (!) 85/58 (!) 89/55 94/61  Pulse: 77 81 82 79  Resp: 20 18 16    Temp: 98.1 F (36.7 C) 97.7 F (36.5 C) 98.2 F (36.8 C)   TempSrc:   Oral   SpO2: 100% 97% 98%   Weight:      Height:        Intake/Output  Summary (Last 24 hours) at 05/19/2021 1349 Last data filed at 05/19/2021 1130 Gross per 24 hour  Intake 1337.47 ml  Output 450 ml  Net 887.47 ml   Filed Weights   05/17/21 2140 05/18/21 0138  Weight: 71 kg 73 kg    Exam: General: NAD  Cardiovascular: S1, S2 present Respiratory: CTAB Abdomen: Soft, nontender, mildly distended, bowel sounds present Musculoskeletal: No bilateral pedal edema noted Skin: Normal Psychiatry: Normal mood    Data Reviewed: CBC: Recent Labs  Lab 05/17/21 1432 05/17/21 2153  05/19/21 0444  WBC 7.7 7.7 5.1  NEUTROABS 4.5  --   --   HGB 13.5 13.0 10.5*  HCT 40.2 39.1 31.3*  MCV 80 79.8* 81.1  PLT 272 255 130   Basic Metabolic Panel: Recent Labs  Lab 05/17/21 1432 05/17/21 2153 05/18/21 1544 05/19/21 0444  NA 138 134* 134* 135  K 3.4* 2.5* 3.0* 3.7  CL 90* 89* 93* 97*  CO2 31* 35* 33* 31  GLUCOSE 87 113* 97 80  BUN 23 28* 23* 23*  CREATININE 1.70* 1.68* 1.13* 1.07*  CALCIUM 10.1 9.6 8.9 8.7*   GFR: Estimated Creatinine Clearance: 52.4 mL/min (A) (by C-G formula based on SCr of 1.07 mg/dL (H)). Liver Function Tests: Recent Labs  Lab 05/17/21 1432 05/17/21 2153  AST 35 39  ALT 38* 39  ALKPHOS 153* 120  BILITOT 0.4 0.8  PROT 7.2 7.7  ALBUMIN 5.0* 4.6   Recent Labs  Lab 05/17/21 2153  LIPASE 35   No results for input(s): AMMONIA in the last 168 hours. Coagulation Profile: No results for input(s): INR, PROTIME in the last 168 hours. Cardiac Enzymes: No results for input(s): CKTOTAL, CKMB, CKMBINDEX, TROPONINI in the last 168 hours. BNP (last 3 results) No results for input(s): PROBNP in the last 8760 hours. HbA1C: No results for input(s): HGBA1C in the last 72 hours. CBG: No results for input(s): GLUCAP in the last 168 hours. Lipid Profile: No results for input(s): CHOL, HDL, LDLCALC, TRIG, CHOLHDL, LDLDIRECT in the last 72 hours. Thyroid Function Tests: No results for input(s): TSH, T4TOTAL, FREET4, T3FREE, THYROIDAB in the last 72 hours. Anemia Panel: No results for input(s): VITAMINB12, FOLATE, FERRITIN, TIBC, IRON, RETICCTPCT in the last 72 hours. Urine analysis:    Component Value Date/Time   COLORURINE YELLOW (A) 05/17/2021 2144   APPEARANCEUR CLEAR (A) 05/17/2021 2144   LABSPEC 1.013 05/17/2021 2144   PHURINE 7.0 05/17/2021 2144   GLUCOSEU NEGATIVE 05/17/2021 2144   HGBUR NEGATIVE 05/17/2021 2144   BILIRUBINUR NEGATIVE 05/17/2021 2144   KETONESUR NEGATIVE 05/17/2021 2144   PROTEINUR NEGATIVE 05/17/2021 2144    NITRITE NEGATIVE 05/17/2021 2144   LEUKOCYTESUR NEGATIVE 05/17/2021 2144   Sepsis Labs: @LABRCNTIP (procalcitonin:4,lacticidven:4)  ) Recent Results (from the past 240 hour(s))  Resp Panel by RT-PCR (Flu A&B, Covid) Nasopharyngeal Swab     Status: None   Collection Time: 05/18/21  9:19 AM   Specimen: Nasopharyngeal Swab; Nasopharyngeal(NP) swabs in vial transport medium  Result Value Ref Range Status   SARS Coronavirus 2 by RT PCR NEGATIVE NEGATIVE Final    Comment: (NOTE) SARS-CoV-2 target nucleic acids are NOT DETECTED.  The SARS-CoV-2 RNA is generally detectable in upper respiratory specimens during the acute phase of infection. The lowest concentration of SARS-CoV-2 viral copies this assay can detect is 138 copies/mL. A negative result does not preclude SARS-Cov-2 infection and should not be used as the sole basis for treatment or other patient management decisions. A negative result may occur  with  improper specimen collection/handling, submission of specimen other than nasopharyngeal swab, presence of viral mutation(s) within the areas targeted by this assay, and inadequate number of viral copies(<138 copies/mL). A negative result must be combined with clinical observations, patient history, and epidemiological information. The expected result is Negative.  Fact Sheet for Patients:  EntrepreneurPulse.com.au  Fact Sheet for Healthcare Providers:  IncredibleEmployment.be  This test is no t yet approved or cleared by the [place] FDA and  has been authorized for detection and/or diagnosis of SARS-CoV-2 by FDA under an Emergency Use Authorization (EUA). This EUA will remain  in effect (meaning this test can be used) for the duration of the COVID-19 declaration under Section 564(b)(1) of the Act, 21 U.S.C.section 360bbb-3(b)(1), unless the authorization is terminated  or revoked sooner.       Influenza A by PCR NEGATIVE NEGATIVE  Final   Influenza B by PCR NEGATIVE NEGATIVE Final    Comment: (NOTE) The Xpert Xpress SARS-CoV-2/FLU/RSV plus assay is intended as an aid in the diagnosis of influenza from Nasopharyngeal swab specimens and should not be used as a sole basis for treatment. Nasal washings and aspirates are unacceptable for Xpert Xpress SARS-CoV-2/FLU/RSV testing.  Fact Sheet for Patients: EntrepreneurPulse.com.au  Fact Sheet for Healthcare Providers: IncredibleEmployment.be  This test is not yet approved or cleared by the [place] FDA and has been authorized for detection and/or diagnosis of SARS-CoV-2 by FDA under an Emergency Use Authorization (EUA). This EUA will remain in effect (meaning this test can be used) for the duration of the COVID-19 declaration under Section 564(b)(1) of the Act, 21 U.S.C. section 360bbb-3(b)(1), unless the authorization is terminated or revoked.  Performed at [facility], [address], [place], [place] [address]       Studies: No results found.  Scheduled Meds:  atorvastatin  20 mg Oral Daily   citalopram  20 mg Oral Daily   cyanocobalamin  1,000 mcg Intramuscular Q30 days   enoxaparin (LOVENOX) injection  40 mg Subcutaneous QHS   FLUoxetine  40 mg Oral BID   fluticasone  1 spray Each Nare Daily   loratadine  10 mg Oral Daily   mometasone-formoterol  2 puff Inhalation BID   montelukast  10 mg Oral Daily   nicotine  21 mg Transdermal Daily   prazosin  2 mg Oral QHS   QUEtiapine  600 mg Oral QHS   tiotropium  18 mcg Inhalation Daily   traZODone  200 mg Oral QHS   zolpidem  10 mg Oral QHS    Continuous Infusions:  lactated ringers Stopped (05/19/21 0924)     LOS: 0 days     Alma Friendly, MD [facility]  If 7PM-7AM, please contact night-coverage [email or web address] 05/19/2021, 1:49 PM

## 2021-05-19 NOTE — Progress Notes (Signed)
GI Inpatient Follow-up Note  Subjective:  Patient with small bowel movement overnight. Distension has decreased. Feels hungry.  Scheduled Inpatient Medications:   atorvastatin  20 mg Oral Daily   citalopram  20 mg Oral Daily   enoxaparin (LOVENOX) injection  40 mg Subcutaneous QHS   FLUoxetine  40 mg Oral BID   nicotine  21 mg Transdermal Daily   prazosin  2 mg Oral QHS   QUEtiapine  600 mg Oral QHS   traZODone  200 mg Oral QHS   zolpidem  10 mg Oral QHS    Continuous Inpatient Infusions:    lactated ringers Stopped (05/19/21 0924)    PRN Inpatient Medications:  acetaminophen **OR** acetaminophen, ondansetron **OR** ondansetron (ZOFRAN) IV  Review of Systems:  Review of Systems  Constitutional:  Negative for chills and fever.  Respiratory:  Negative for shortness of breath.   Cardiovascular:  Negative for chest pain.  Gastrointestinal:  Positive for constipation. Negative for abdominal pain, blood in stool and melena.  Musculoskeletal:  Positive for joint pain.  Skin:  Negative for itching and rash.  Neurological:  Negative for focal weakness.  Psychiatric/Behavioral:  Negative for substance abuse.   All other systems reviewed and are negative.    Physical Examination: BP 94/61 (BP Location: Right Arm)   Pulse 79   Temp 98.2 F (36.8 C) (Oral)   Resp 16   Ht 5\' 1"  (1.549 m)   Wt 73 kg   SpO2 98%   BMI 30.42 kg/m  Gen: NAD, alert and oriented x 4 HEENT: PEERLA, EOMI, Neck: supple, no JVD or thyromegaly Chest: No respiratory distress CV: RRR Abd: some distension but improved from yesterday Ext: no edema, well perfused with 2+ pulses, Skin: no rash or lesions noted Lymph: No lymphadenopathy  Data: Lab Results  Component Value Date   WBC 5.1 05/19/2021   HGB 10.5 (L) 05/19/2021   HCT 31.3 (L) 05/19/2021   MCV 81.1 05/19/2021   PLT 192 05/19/2021   Recent Labs  Lab 05/17/21 1432 05/17/21 2153 05/19/21 0444  HGB 13.5 13.0 10.5*   Lab Results   Component Value Date   NA 135 05/19/2021   K 3.7 05/19/2021   CL 97 (L) 05/19/2021   CO2 31 05/19/2021   BUN 23 (H) 05/19/2021   CREATININE 1.07 (H) 05/19/2021   Lab Results  Component Value Date   ALT 39 05/17/2021   AST 39 05/17/2021   GGT 44 05/17/2021   ALKPHOS 120 05/17/2021   BILITOT 0.8 05/17/2021   No results for input(s): APTT, INR, PTT in the last 168 hours. Assessment/Plan: 60 y/o lady with history of constipation who presents with severe constipation  Recommendations:  - continue IV fluids - replace electrolytes aggressively - advanced diet (ordered) - continue golytely until more adequate bowel movement - if bowel movement then likely can be discharged home this afternoon. Would need to be on metamucil and miralax BID until GI follow-up   Will continue to follow, please call if any questions or concerns.  Raylene Miyamoto MD, MPH [place]

## 2021-05-19 NOTE — Plan of Care (Signed)
  Problem: Nutrition: Goal: Adequate nutrition will be maintained Outcome: Progressing   Problem: Elimination: Goal: Will not experience complications related to bowel motility Outcome: Progressing   Problem: Pain Managment: Goal: General experience of comfort will improve Outcome: Progressing   

## 2021-05-20 LAB — CBC WITH DIFFERENTIAL/PLATELET
Abs Immature Granulocytes: 0.01 10*3/uL (ref 0.00–0.07)
Basophils Absolute: 0 10*3/uL (ref 0.0–0.1)
Basophils Relative: 1 %
Eosinophils Absolute: 0.3 10*3/uL (ref 0.0–0.5)
Eosinophils Relative: 6 %
HCT: 30.6 % — ABNORMAL LOW (ref 36.0–46.0)
Hemoglobin: 10 g/dL — ABNORMAL LOW (ref 12.0–15.0)
Immature Granulocytes: 0 %
Lymphocytes Relative: 39 %
Lymphs Abs: 2.2 10*3/uL (ref 0.7–4.0)
MCH: 26.4 pg (ref 26.0–34.0)
MCHC: 32.7 g/dL (ref 30.0–36.0)
MCV: 80.7 fL (ref 80.0–100.0)
Monocytes Absolute: 0.6 10*3/uL (ref 0.1–1.0)
Monocytes Relative: 11 %
Neutro Abs: 2.4 10*3/uL (ref 1.7–7.7)
Neutrophils Relative %: 43 %
Platelets: 195 10*3/uL (ref 150–400)
RBC: 3.79 MIL/uL — ABNORMAL LOW (ref 3.87–5.11)
RDW: 12.7 % (ref 11.5–15.5)
WBC: 5.7 10*3/uL (ref 4.0–10.5)
nRBC: 0 % (ref 0.0–0.2)

## 2021-05-20 LAB — BASIC METABOLIC PANEL
Anion gap: 5 (ref 5–15)
BUN: 22 mg/dL — ABNORMAL HIGH (ref 6–20)
CO2: 32 mmol/L (ref 22–32)
Calcium: 8.6 mg/dL — ABNORMAL LOW (ref 8.9–10.3)
Chloride: 101 mmol/L (ref 98–111)
Creatinine, Ser: 1.02 mg/dL — ABNORMAL HIGH (ref 0.44–1.00)
GFR, Estimated: >60 mL/min (ref >60)
Glucose, Bld: 95 mg/dL (ref 70–99)
Potassium: 4 mmol/L (ref 3.5–5.1)
Sodium: 138 mmol/L (ref 135–145)

## 2021-05-20 MED ORDER — GLYCERIN (LAXATIVE) 2 G RE SUPP
1.0000 | Freq: Once | RECTAL | Status: AC
  Administered 2021-05-20: 1 via RECTAL
  Filled 2021-05-20: qty 1

## 2021-05-20 MED ORDER — SODIUM CHLORIDE 0.9 % IV SOLN: INTRAVENOUS | Status: DC

## 2021-05-20 MED ORDER — SODIUM CHLORIDE 0.9 % IV BOLUS
1000.0000 mL | Freq: Once | INTRAVENOUS | Status: AC
  Administered 2021-05-20: 1000 mL via INTRAVENOUS

## 2021-05-20 NOTE — Progress Notes (Signed)
PROGRESS NOTE  Anna Cruz QQV:[id number] DOB: Feb 24, 1961 DOA: 05/18/2021 PCP: [facility]  HPI/Recap of past 24 hours: Anna Cruz is a 60 y.o. female with medical history significant for HTN, HLD, depression/anxiety, tobacco abuse, GERD, COPD, constipation, history of alcohol abuse, presents to the ED with chief concerns of abdominal pain for the past 3 weeks. Reports no BM for 2 weeks, and that's not normal for her. She endorses nausea and vomiting. Reports that she has tried multiple stool softeners over the course of the last 2 weeks without any improvements. Pt endorses 45 pound weight gain in 6 months. Last EtOH drink was Apr 12, 2020. Currently staying at a female residential home for recovering addict[facility], [facility]. Pt is on 3L of O2 at baseline.  GI consulted, patient admitted for further management.     Noted small amount of liquid diarrhea, most likely from overflow with some bloating.  Denied any worsening abdominal pain, denies any nausea/vomiting, fever/chills, chest pain, worsening shortness of breath.      Assessment/Plan: Principal Problem:   Abdominal pain Active Problems:   AKI (acute kidney injury) ([place])   Constipation   Hyperlipidemia   Tobacco abuse   COPD with chronic bronchitis (HCC)   Hypokalemia   Abdominal pain-in setting of chronic constipation No bowel movement for 2 to 3 weeks GI consulted, appreciate recs, tap water enema, GoLytely, disimpaction.  Plan for suppository and more enema if no results GI advanced diet Continue IV fluids   AKI Likely 2/2 poor p.o. intake in the setting of significant constipation Continue IV fluids Daily BMP  Hypokalemia Replace as needed  Hypertension BP soft Hold home hydrochlorothiazide Continue IV fluids  Iron deficiency anemia Baseline hemoglobin normal, dropped to 10.5 likely 2/2 hemodilution No evidence of bleeding Daily CBC  Hyperlipidemia Continue atorvastatin  20 mg daily  COPD/chronic hypoxic respiratory failure Stable at baseline O2, 3 L, saturating well Continue inhalers, duo nebs as needed, Singulair  Depression/anxiety Continue Celexa 20 mg daily, fluoxetine 40 mg twice daily, Atarax as needed  Insomnia Continue quetiapine 600 mg p.o. nightly, trazodone 200 mg daily nightly, zolpidem 10 mg nightly   History of alcohol abuse Patient has been sober since May 2021 Previously drank about 2 bottle of Shearon Stalls daily  Tobacco abuse Advised to quit Nicotine patch  Obesity Lifestyle modification advised      Estimated body mass index is 30.42 kg/m as calculated from the following:   Height as of this encounter: 5\' 1"  (1.549 m).   Weight as of this encounter: 73 kg.     Code Status: Full  Family Communication: None at bedside  Disposition Plan: Status is: Inpatient  The patient will require care spanning > 2 midnights and should be moved to inpatient because: Inpatient level of care appropriate due to severity of illness  Dispo: The patient is from: Group home              Anticipated d/c is to: Group home              Patient currently is not medically stable to d/c.   Difficult to place patient No    Consultants: GI  Procedures: None  Antimicrobials: None  DVT prophylaxis: Lovenox   Objective: Vitals:   05/20/21 0032 05/20/21 0440 05/20/21 0751 05/20/21 1535  BP: (!) 122/94 98/70 (!) 87/62 103/65  Pulse: 75 79 73 77  Resp: 18 16 15 16   Temp: 98.2 F (36.8 C) 98.3 F (36.8  C) 98 F (36.7 C) 98.1 F (36.7 C)  TempSrc:  Oral Oral Oral  SpO2: 98% 97% 100% 94%  Weight:      Height:        Intake/Output Summary (Last 24 hours) at 05/20/2021 1624 Last data filed at 05/20/2021 1538 Gross per 24 hour  Intake 732 ml  Output 600 ml  Net 132 ml   Filed Weights   05/17/21 2140 05/18/21 0138  Weight: 71 kg 73 kg    Exam: General: NAD  Cardiovascular: S1, S2 present Respiratory: CTAB Abdomen:  Soft, nontender, mildly distended, bowel sounds present Musculoskeletal: No bilateral pedal edema noted Skin: Normal Psychiatry: Normal mood    Data Reviewed: CBC: Recent Labs  Lab 05/17/21 1432 05/17/21 2153 05/19/21 0444 05/20/21 0529  WBC 7.7 7.7 5.1 5.7  NEUTROABS 4.5  --   --  2.4  HGB 13.5 13.0 10.5* 10.0*  HCT 40.2 39.1 31.3* 30.6*  MCV 80 79.8* 81.1 80.7  PLT 272 255 192 323   Basic Metabolic Panel: Recent Labs  Lab 05/17/21 1432 05/17/21 2153 05/18/21 1544 05/19/21 0444 05/20/21 0529  NA 138 134* 134* 135 138  K 3.4* 2.5* 3.0* 3.7 4.0  CL 90* 89* 93* 97* 101  CO2 31* 35* 33* 31 32  GLUCOSE 87 113* 97 80 95  BUN 23 28* 23* 23* 22*  CREATININE 1.70* 1.68* 1.13* 1.07* 1.02*  CALCIUM 10.1 9.6 8.9 8.7* 8.6*   GFR: Estimated Creatinine Clearance: 55 mL/min (A) (by C-G formula based on SCr of 1.02 mg/dL (H)). Liver Function Tests: Recent Labs  Lab 05/17/21 1432 05/17/21 2153  AST 35 39  ALT 38* 39  ALKPHOS 153* 120  BILITOT 0.4 0.8  PROT 7.2 7.7  ALBUMIN 5.0* 4.6   Recent Labs  Lab 05/17/21 2153  LIPASE 35   No results for input(s): AMMONIA in the last 168 hours. Coagulation Profile: No results for input(s): INR, PROTIME in the last 168 hours. Cardiac Enzymes: No results for input(s): CKTOTAL, CKMB, CKMBINDEX, TROPONINI in the last 168 hours. BNP (last 3 results) No results for input(s): PROBNP in the last 8760 hours. HbA1C: No results for input(s): HGBA1C in the last 72 hours. CBG: No results for input(s): GLUCAP in the last 168 hours. Lipid Profile: No results for input(s): CHOL, HDL, LDLCALC, TRIG, CHOLHDL, LDLDIRECT in the last 72 hours. Thyroid Function Tests: No results for input(s): TSH, T4TOTAL, FREET4, T3FREE, THYROIDAB in the last 72 hours. Anemia Panel: No results for input(s): VITAMINB12, FOLATE, FERRITIN, TIBC, IRON, RETICCTPCT in the last 72 hours. Urine analysis:    Component Value Date/Time   COLORURINE YELLOW (A)  05/17/2021 2144   APPEARANCEUR CLEAR (A) 05/17/2021 2144   LABSPEC 1.013 05/17/2021 2144   PHURINE 7.0 05/17/2021 2144   GLUCOSEU NEGATIVE 05/17/2021 2144   HGBUR NEGATIVE 05/17/2021 2144   BILIRUBINUR NEGATIVE 05/17/2021 2144   KETONESUR NEGATIVE 05/17/2021 2144   PROTEINUR NEGATIVE 05/17/2021 2144   NITRITE NEGATIVE 05/17/2021 2144   LEUKOCYTESUR NEGATIVE 05/17/2021 2144   Sepsis Labs: @LABRCNTIP (procalcitonin:4,lacticidven:4)  ) Recent Results (from the past 240 hour(s))  Resp Panel by RT-PCR (Flu A&B, Covid) Nasopharyngeal Swab     Status: None   Collection Time: 05/18/21  9:19 AM   Specimen: Nasopharyngeal Swab; Nasopharyngeal(NP) swabs in vial transport medium  Result Value Ref Range Status   SARS Coronavirus 2 by RT PCR NEGATIVE NEGATIVE Final    Comment: (NOTE) SARS-CoV-2 target nucleic acids are NOT DETECTED.  The SARS-CoV-2 RNA is  generally detectable in upper respiratory specimens during the acute phase of infection. The lowest concentration of SARS-CoV-2 viral copies this assay can detect is 138 copies/mL. A negative result does not preclude SARS-Cov-2 infection and should not be used as the sole basis for treatment or other patient management decisions. A negative result may occur with  improper specimen collection/handling, submission of specimen other than nasopharyngeal swab, presence of viral mutation(s) within the areas targeted by this assay, and inadequate number of viral copies(<138 copies/mL). A negative result must be combined with clinical observations, patient history, and epidemiological information. The expected result is Negative.  Fact Sheet for Patients:  EntrepreneurPulse.com.au  Fact Sheet for Healthcare Providers:  IncredibleEmployment.be  This test is no t yet approved or cleared by the [place] FDA and  has been authorized for detection and/or diagnosis of SARS-CoV-2 by FDA under an Emergency Use  Authorization (EUA). This EUA will remain  in effect (meaning this test can be used) for the duration of the COVID-19 declaration under Section 564(b)(1) of the Act, 21 U.S.C.section 360bbb-3(b)(1), unless the authorization is terminated  or revoked sooner.       Influenza A by PCR NEGATIVE NEGATIVE Final   Influenza B by PCR NEGATIVE NEGATIVE Final    Comment: (NOTE) The Xpert Xpress SARS-CoV-2/FLU/RSV plus assay is intended as an aid in the diagnosis of influenza from Nasopharyngeal swab specimens and should not be used as a sole basis for treatment. Nasal washings and aspirates are unacceptable for Xpert Xpress SARS-CoV-2/FLU/RSV testing.  Fact Sheet for Patients: EntrepreneurPulse.com.au  Fact Sheet for Healthcare Providers: IncredibleEmployment.be  This test is not yet approved or cleared by the [place] FDA and has been authorized for detection and/or diagnosis of SARS-CoV-2 by FDA under an Emergency Use Authorization (EUA). This EUA will remain in effect (meaning this test can be used) for the duration of the COVID-19 declaration under Section 564(b)(1) of the Act, 21 U.S.C. section 360bbb-3(b)(1), unless the authorization is terminated or revoked.  Performed at [facility], [address] [place], [place] [address]       Studies: No results found.  Scheduled Meds:  atorvastatin  20 mg Oral Daily   citalopram  20 mg Oral Daily   cyanocobalamin  1,000 mcg Intramuscular Q30 days   enoxaparin (LOVENOX) injection  40 mg Subcutaneous QHS   FLUoxetine  40 mg Oral BID   fluticasone  1 spray Each Nare Daily   Glycerin (Adult)  1 suppository Rectal Once   loratadine  10 mg Oral Daily   mometasone-formoterol  2 puff Inhalation BID   montelukast  10 mg Oral Daily   nicotine  21 mg Transdermal Daily   prazosin  2 mg Oral QHS   QUEtiapine  600 mg Oral QHS   tiotropium  18 mcg Inhalation Daily   traZODone  200 mg Oral  QHS   zolpidem  10 mg Oral QHS    Continuous Infusions:  sodium chloride 100 mL/hr at 05/20/21 1120     LOS: 1 day     Alma Friendly, MD [facility]  If 7PM-7AM, please contact night-coverage [email or web address] 05/20/2021, 4:24 PM

## 2021-05-20 NOTE — Progress Notes (Signed)
GI Inpatient Follow-up Note  Subjective:  Patient seen and had small amount of liquid diarrhea but nothing significant. Some bloating.  Scheduled Inpatient Medications:   atorvastatin  20 mg Oral Daily   citalopram  20 mg Oral Daily   cyanocobalamin  1,000 mcg Intramuscular Q30 days   enoxaparin (LOVENOX) injection  40 mg Subcutaneous QHS   FLUoxetine  40 mg Oral BID   fluticasone  1 spray Each Nare Daily   Glycerin (Adult)  1 suppository Rectal Once   loratadine  10 mg Oral Daily   mometasone-formoterol  2 puff Inhalation BID   montelukast  10 mg Oral Daily   nicotine  21 mg Transdermal Daily   prazosin  2 mg Oral QHS   QUEtiapine  600 mg Oral QHS   tiotropium  18 mcg Inhalation Daily   traZODone  200 mg Oral QHS   zolpidem  10 mg Oral QHS    Continuous Inpatient Infusions:    sodium chloride     sodium chloride      PRN Inpatient Medications:  acetaminophen **OR** acetaminophen, albuterol, alum & mag hydroxide-simeth, hydrOXYzine, ketorolac, ondansetron **OR** ondansetron (ZOFRAN) IV  Review of Systems:  Review of Systems  Constitutional:  Negative for chills and fever.  Respiratory:  Negative for cough.   Cardiovascular:  Negative for chest pain.  Gastrointestinal:  Positive for constipation. Negative for abdominal pain, blood in stool and melena.  Skin:  Negative for itching and rash.  Neurological:  Negative for focal weakness.  Psychiatric/Behavioral:  Negative for substance abuse.   All other systems reviewed and are negative.    Physical Examination: BP (!) 87/62 (BP Location: Right Arm)   Pulse 73   Temp 98 F (36.7 C) (Oral)   Resp 15   Ht 5\' 1"  (1.549 m)   Wt 73 kg   SpO2 100%   BMI 30.42 kg/m  Gen: NAD, alert and oriented x 4 HEENT: PEERLA, EOMI, Neck: supple, no JVD or thyromegaly Chest: No respiratory distress CV: RRR Abd: soft, non-tender, non-distended Ext: no edema, well perfused with 2+ pulses, Skin: no rash or lesions noted Lymph: no  LAD  Data: Lab Results  Component Value Date   WBC 5.7 05/20/2021   HGB 10.0 (L) 05/20/2021   HCT 30.6 (L) 05/20/2021   MCV 80.7 05/20/2021   PLT 195 05/20/2021   Recent Labs  Lab 05/17/21 2153 05/19/21 0444 05/20/21 0529  HGB 13.0 10.5* 10.0*   Lab Results  Component Value Date   NA 138 05/20/2021   K 4.0 05/20/2021   CL 101 05/20/2021   CO2 32 05/20/2021   BUN 22 (H) 05/20/2021   CREATININE 1.02 (H) 05/20/2021   Lab Results  Component Value Date   ALT 39 05/17/2021   AST 39 05/17/2021   GGT 44 05/17/2021   ALKPHOS 120 05/17/2021   BILITOT 0.8 05/17/2021   No results for input(s): APTT, INR, PTT in the last 168 hours. Assessment/Plan: 60 y/o lady with severe constipation  Recommendations:  60 y/o lady with history of constipation who presents with severe constipation   - continue IV fluids - replace electrolytes aggressively - continue golytely until more adequate bowel movement - will order suppository, if no improvement by afternoon, would recommend enema    Will continue to follow, please call if any questions or concerns.  Raylene Miyamoto MD, MPH [place]

## 2021-05-21 ENCOUNTER — Telehealth: Payer: Medicaid Other | Admitting: Gastroenterology

## 2021-05-21 LAB — BASIC METABOLIC PANEL
Anion gap: 4 — ABNORMAL LOW (ref 5–15)
BUN: 16 mg/dL (ref 6–20)
CO2: 27 mmol/L (ref 22–32)
Calcium: 8.1 mg/dL — ABNORMAL LOW (ref 8.9–10.3)
Chloride: 109 mmol/L (ref 98–111)
Creatinine, Ser: 0.97 mg/dL (ref 0.44–1.00)
GFR, Estimated: >60 mL/min (ref >60)
Glucose, Bld: 119 mg/dL — ABNORMAL HIGH (ref 70–99)
Potassium: 4.3 mmol/L (ref 3.5–5.1)
Sodium: 140 mmol/L (ref 135–145)

## 2021-05-21 LAB — CBC WITH DIFFERENTIAL/PLATELET
Abs Immature Granulocytes: 0.01 10*3/uL (ref 0.00–0.07)
Basophils Absolute: 0 10*3/uL (ref 0.0–0.1)
Basophils Relative: 1 %
Eosinophils Absolute: 0.4 10*3/uL (ref 0.0–0.5)
Eosinophils Relative: 8 %
HCT: 28.9 % — ABNORMAL LOW (ref 36.0–46.0)
Hemoglobin: 9.2 g/dL — ABNORMAL LOW (ref 12.0–15.0)
Immature Granulocytes: 0 %
Lymphocytes Relative: 41 %
Lymphs Abs: 2.3 10*3/uL (ref 0.7–4.0)
MCH: 26.4 pg (ref 26.0–34.0)
MCHC: 31.8 g/dL (ref 30.0–36.0)
MCV: 82.8 fL (ref 80.0–100.0)
Monocytes Absolute: 0.6 10*3/uL (ref 0.1–1.0)
Monocytes Relative: 10 %
Neutro Abs: 2.2 10*3/uL (ref 1.7–7.7)
Neutrophils Relative %: 40 %
Platelets: 169 10*3/uL (ref 150–400)
RBC: 3.49 MIL/uL — ABNORMAL LOW (ref 3.87–5.11)
RDW: 13 % (ref 11.5–15.5)
WBC: 5.5 10*3/uL (ref 4.0–10.5)
nRBC: 0 % (ref 0.0–0.2)

## 2021-05-21 MED ORDER — SORBITOL 70 % SOLN
960.0000 mL | TOPICAL_OIL | Freq: Once | ORAL | Status: AC
  Administered 2021-05-21: 960 mL via RECTAL
  Filled 2021-05-21: qty 240

## 2021-05-21 MED ORDER — FLEET ENEMA 7-19 GM/118ML RE ENEM: 1.0000 | ENEMA | Freq: Once | RECTAL | Status: DC

## 2021-05-21 NOTE — Progress Notes (Signed)
GI Inpatient Follow-up Note  Subjective:  Patient still with limited bowel movement.  Scheduled Inpatient Medications:   atorvastatin  20 mg Oral Daily   citalopram  20 mg Oral Daily   cyanocobalamin  1,000 mcg Intramuscular Q30 days   enoxaparin (LOVENOX) injection  40 mg Subcutaneous QHS   FLUoxetine  40 mg Oral BID   fluticasone  1 spray Each Nare Daily   loratadine  10 mg Oral Daily   mometasone-formoterol  2 puff Inhalation BID   montelukast  10 mg Oral Daily   nicotine  21 mg Transdermal Daily   prazosin  2 mg Oral QHS   QUEtiapine  600 mg Oral QHS   sorbitol, milk of mag, mineral oil, glycerin (SMOG) enema  960 mL Rectal Once   tiotropium  18 mcg Inhalation Daily   traZODone  200 mg Oral QHS   zolpidem  10 mg Oral QHS    Continuous Inpatient Infusions:    sodium chloride 100 mL/hr at 05/21/21 0810    PRN Inpatient Medications:  albuterol, alum & mag hydroxide-simeth, hydrOXYzine, ketorolac, ondansetron **OR** ondansetron (ZOFRAN) IV  Review of Systems:  Review of Systems  Constitutional:  Negative for chills and fever.  Respiratory:  Negative for cough.   Cardiovascular:  Negative for chest pain.  Gastrointestinal:  Positive for constipation and diarrhea. Negative for abdominal pain, blood in stool, melena, nausea and vomiting.  Skin:  Negative for itching and rash.  Neurological:  Negative for focal weakness.  Psychiatric/Behavioral:  Negative for substance abuse.   All other systems reviewed and are negative.    Physical Examination: BP 94/61   Pulse 74   Temp 98.5 F (36.9 C)   Resp 16   Ht 5\' 1"  (1.549 m)   Wt 73 kg   SpO2 99%   BMI 30.42 kg/m  Gen: NAD, alert and oriented x 4 HEENT: PEERLA, EOMI, Neck: supple, no JVD or thyromegaly Chest: No respiratory distress CV: RRR Abd: distended Ext: no edema, well perfused with 2+ pulses, Skin: no rash or lesions noted Lymph: no LAD  Data: Lab Results  Component Value Date   WBC 5.5 05/21/2021    HGB 9.2 (L) 05/21/2021   HCT 28.9 (L) 05/21/2021   MCV 82.8 05/21/2021   PLT 169 05/21/2021   Recent Labs  Lab 05/19/21 0444 05/20/21 0529 05/21/21 0634  HGB 10.5* 10.0* 9.2*    Lab Results  Component Value Date   NA 140 05/21/2021   K 4.3 05/21/2021   CL 109 05/21/2021   CO2 27 05/21/2021   BUN 16 05/21/2021   CREATININE 0.97 05/21/2021   Lab Results  Component Value Date   ALT 39 05/17/2021   AST 39 05/17/2021   GGT 44 05/17/2021   ALKPHOS 120 05/17/2021   BILITOT 0.8 05/17/2021   No results for input(s): APTT, INR, PTT in the last 168 hours. Assessment/Plan: 60 y/o lady with severe constipation  Recommendations:  60 y/o lady with history of constipation who presents with severe constipation   - continue IV fluids - will need rectal enema, repeat as needed until bowel movement - if no significant movement will plan on flex sig tomorrow - clear liquid diet, NPO at midnight    Will continue to follow, please call if any questions or concerns.  Raylene Miyamoto MD, MPH [place]

## 2021-05-21 NOTE — Progress Notes (Signed)
PROGRESS NOTE  Anna Cruz QIO:[id number] DOB: July 14, 1961 DOA: 05/18/2021 PCP: [facility], [facility], [place]  HPI/Recap of past 24 hours: Anna Cruz is a 60 y.o. female with medical history significant for HTN, HLD, depression/anxiety, tobacco abuse, GERD, COPD, constipation, history of alcohol abuse, presents to the ED with chief concerns of abdominal pain for the past 3 weeks. Reports no BM for 2 weeks, and that's not normal for her. She endorses nausea and vomiting. Reports that she has tried multiple stool softeners over the course of the last 2 weeks without any improvements. Pt endorses 45 pound weight gain in 6 months. Last EtOH drink was Apr 12, 2020. Currently staying at a female residential home for recovering addicts, [facility]. Pt is on 3L of O2 at baseline.  GI consulted, patient admitted for further management.    Today, patient still with small amount of liquid diarrhea, with bloating and abdominal discomfort.  Denies any nausea/vomiting, fever/chills, worsening shortness of breath.      Assessment/Plan: Principal Problem:   Abdominal pain Active Problems:   AKI (acute kidney injury) ([place])   Constipation   Hyperlipidemia   Tobacco abuse   COPD with chronic bronchitis (HCC)   Hypokalemia   Abdominal pain-in setting of chronic constipation No bowel movement for 2 to 3 weeks GI consulted, appreciate recs, tap water enema, GoLytely, disimpaction.  Plan for suppository and more enema if no results GI advanced diet Continue IV fluids   AKI Likely 2/2 poor p.o. intake in the setting of significant constipation Continue IV fluids Daily BMP  Hypokalemia Replace as needed  Hypertension BP soft Hold home hydrochlorothiazide Continue IV fluids  Iron deficiency anemia Baseline hemoglobin normal, dropped to 10.5 likely 2/2 hemodilution No evidence of bleeding Daily CBC  Hyperlipidemia Continue atorvastatin 20 mg daily  COPD/chronic hypoxic  respiratory failure Stable at baseline O2, 3 L, saturating well Continue inhalers, duo nebs as needed, Singulair  Depression/anxiety Continue Celexa 20 mg daily, fluoxetine 40 mg twice daily, Atarax as needed  Insomnia Continue quetiapine 600 mg p.o. nightly, trazodone 200 mg daily nightly, zolpidem 10 mg nightly   History of alcohol abuse Patient has been sober since May 2021 Previously drank about 2 bottle of Shearon Stalls daily  Tobacco abuse Advised to quit Nicotine patch  Obesity Lifestyle modification advised      Estimated body mass index is 30.42 kg/m as calculated from the following:   Height as of this encounter: 5\' 1"  (1.549 m).   Weight as of this encounter: 73 kg.     Code Status: Full  Family Communication: None at bedside  Disposition Plan: Status is: Inpatient  The patient will require care spanning > 2 midnights and should be moved to inpatient because: Inpatient level of care appropriate due to severity of illness  Dispo: The patient is from: Group home              Anticipated d/c is to: Group home              Patient currently is not medically stable to d/c.   Difficult to place patient No    Consultants: GI  Procedures: None  Antimicrobials: None  DVT prophylaxis: Lovenox   Objective: Vitals:   05/20/21 1535 05/20/21 1935 05/21/21 0431 05/21/21 0745  BP: 103/65 112/68 98/60 94/61   Pulse: 77 77 90 74  Resp: 16 16 20 16   Temp: 98.1 F (36.7 C) 98.4 F (36.9 C) 98.7 F (37.1 C) 98.5 F (36.9 C)  TempSrc: Oral Oral Oral   SpO2: 94% 95% 93% 99%  Weight:      Height:        Intake/Output Summary (Last 24 hours) at 05/21/2021 1506 Last data filed at 05/21/2021 0545 Gross per 24 hour  Intake --  Output 600 ml  Net -600 ml   Filed Weights   05/17/21 2140 05/18/21 0138  Weight: 71 kg 73 kg    Exam: General: NAD  Cardiovascular: S1, S2 present Respiratory: CTAB Abdomen: Soft, nontender, mildly distended, bowel sounds  present Musculoskeletal: No bilateral pedal edema noted Skin: Normal Psychiatry: Normal mood    Data Reviewed: CBC: Recent Labs  Lab 05/17/21 1432 05/17/21 2153 05/19/21 0444 05/20/21 0529 05/21/21 0634  WBC 7.7 7.7 5.1 5.7 5.5  NEUTROABS 4.5  --   --  2.4 2.2  HGB 13.5 13.0 10.5* 10.0* 9.2*  HCT 40.2 39.1 31.3* 30.6* 28.9*  MCV 80 79.8* 81.1 80.7 82.8  PLT 272 255 192 195 017   Basic Metabolic Panel: Recent Labs  Lab 05/17/21 2153 05/18/21 1544 05/19/21 0444 05/20/21 0529 05/21/21 0634  NA 134* 134* 135 138 140  K 2.5* 3.0* 3.7 4.0 4.3  CL 89* 93* 97* 101 109  CO2 35* 33* 31 32 27  GLUCOSE 113* 97 80 95 119*  BUN 28* 23* 23* 22* 16  CREATININE 1.68* 1.13* 1.07* 1.02* 0.97  CALCIUM 9.6 8.9 8.7* 8.6* 8.1*   GFR: Estimated Creatinine Clearance: 57.8 mL/min (by C-G formula based on SCr of 0.97 mg/dL). Liver Function Tests: Recent Labs  Lab 05/17/21 1432 05/17/21 2153  AST 35 39  ALT 38* 39  ALKPHOS 153* 120  BILITOT 0.4 0.8  PROT 7.2 7.7  ALBUMIN 5.0* 4.6   Recent Labs  Lab 05/17/21 2153  LIPASE 35   No results for input(s): AMMONIA in the last 168 hours. Coagulation Profile: No results for input(s): INR, PROTIME in the last 168 hours. Cardiac Enzymes: No results for input(s): CKTOTAL, CKMB, CKMBINDEX, TROPONINI in the last 168 hours. BNP (last 3 results) No results for input(s): PROBNP in the last 8760 hours. HbA1C: No results for input(s): HGBA1C in the last 72 hours. CBG: No results for input(s): GLUCAP in the last 168 hours. Lipid Profile: No results for input(s): CHOL, HDL, LDLCALC, TRIG, CHOLHDL, LDLDIRECT in the last 72 hours. Thyroid Function Tests: No results for input(s): TSH, T4TOTAL, FREET4, T3FREE, THYROIDAB in the last 72 hours. Anemia Panel: No results for input(s): VITAMINB12, FOLATE, FERRITIN, TIBC, IRON, RETICCTPCT in the last 72 hours. Urine analysis:    Component Value Date/Time   COLORURINE YELLOW (A) 05/17/2021 2144    APPEARANCEUR CLEAR (A) 05/17/2021 2144   LABSPEC 1.013 05/17/2021 2144   PHURINE 7.0 05/17/2021 2144   GLUCOSEU NEGATIVE 05/17/2021 2144   HGBUR NEGATIVE 05/17/2021 2144   BILIRUBINUR NEGATIVE 05/17/2021 2144   KETONESUR NEGATIVE 05/17/2021 2144   PROTEINUR NEGATIVE 05/17/2021 2144   NITRITE NEGATIVE 05/17/2021 2144   LEUKOCYTESUR NEGATIVE 05/17/2021 2144   Sepsis Labs: @LABRCNTIP (procalcitonin:4,lacticidven:4)  ) Recent Results (from the past 240 hour(s))  Resp Panel by RT-PCR (Flu A&B, Covid) Nasopharyngeal Swab     Status: None   Collection Time: 05/18/21  9:19 AM   Specimen: Nasopharyngeal Swab; Nasopharyngeal(NP) swabs in vial transport medium  Result Value Ref Range Status   SARS Coronavirus 2 by RT PCR NEGATIVE NEGATIVE Final    Comment: (NOTE) SARS-CoV-2 target nucleic acids are NOT DETECTED.  The SARS-CoV-2 RNA is generally detectable in upper respiratory  specimens during the acute phase of infection. The lowest concentration of SARS-CoV-2 viral copies this assay can detect is 138 copies/mL. A negative result does not preclude SARS-Cov-2 infection and should not be used as the sole basis for treatment or other patient management decisions. A negative result may occur with  improper specimen collection/handling, submission of specimen other than nasopharyngeal swab, presence of viral mutation(s) within the areas targeted by this assay, and inadequate number of viral copies(<138 copies/mL). A negative result must be combined with clinical observations, patient history, and epidemiological information. The expected result is Negative.  Fact Sheet for Patients:  EntrepreneurPulse.com.au  Fact Sheet for Healthcare Providers:  IncredibleEmployment.be  This test is no t yet approved or cleared by the [place] FDA and  has been authorized for detection and/or diagnosis of SARS-CoV-2 by FDA under an Emergency Use Authorization  (EUA). This EUA will remain  in effect (meaning this test can be used) for the duration of the COVID-19 declaration under Section 564(b)(1) of the Act, 21 U.S.C.section 360bbb-3(b)(1), unless the authorization is terminated  or revoked sooner.       Influenza A by PCR NEGATIVE NEGATIVE Final   Influenza B by PCR NEGATIVE NEGATIVE Final    Comment: (NOTE) The Xpert Xpress SARS-CoV-2/FLU/RSV plus assay is intended as an aid in the diagnosis of influenza from Nasopharyngeal swab specimens and should not be used as a sole basis for treatment. Nasal washings and aspirates are unacceptable for Xpert Xpress SARS-CoV-2/FLU/RSV testing.  Fact Sheet for Patients: EntrepreneurPulse.com.au  Fact Sheet for Healthcare Providers: IncredibleEmployment.be  This test is not yet approved or cleared by the [place] FDA and has been authorized for detection and/or diagnosis of SARS-CoV-2 by FDA under an Emergency Use Authorization (EUA). This EUA will remain in effect (meaning this test can be used) for the duration of the COVID-19 declaration under Section 564(b)(1) of the Act, 21 U.S.C. section 360bbb-3(b)(1), unless the authorization is terminated or revoked.  Performed at [facility], [address], [place], [place] [address]       Studies: No results found.  Scheduled Meds:  atorvastatin  20 mg Oral Daily   citalopram  20 mg Oral Daily   cyanocobalamin  1,000 mcg Intramuscular Q30 days   enoxaparin (LOVENOX) injection  40 mg Subcutaneous QHS   FLUoxetine  40 mg Oral BID   fluticasone  1 spray Each Nare Daily   loratadine  10 mg Oral Daily   mometasone-formoterol  2 puff Inhalation BID   montelukast  10 mg Oral Daily   nicotine  21 mg Transdermal Daily   prazosin  2 mg Oral QHS   QUEtiapine  600 mg Oral QHS   sodium phosphate  1 enema Rectal Once   tiotropium  18 mcg Inhalation Daily   traZODone  200 mg Oral QHS   zolpidem  10  mg Oral QHS    Continuous Infusions:  sodium chloride 100 mL/hr at 05/21/21 0810     LOS: 2 days     Alma Friendly, MD [facility]  If 7PM-7AM, please contact night-coverage [email or web address] 05/21/2021, 3:06 PM

## 2021-05-21 NOTE — Progress Notes (Signed)
Patient given SMOG enema.  Patient able to tolerate 544ml out of 964ml of solution.  Patient refused the rest of enema.  Patient noted to be on toilet, results noted.  Will continue to monitor and report off to oncoming shift.

## 2021-05-22 ENCOUNTER — Encounter
Admission: EM | Disposition: A | Discharge status: Home / Self Care | Payer: Self-pay | Source: Home / Self Care | Attending: Internal Medicine

## 2021-05-22 ENCOUNTER — Inpatient Hospital Stay: Payer: Medicaid Other | Admitting: Certified Registered"

## 2021-05-22 ENCOUNTER — Inpatient Hospital Stay: Payer: Medicaid Other

## 2021-05-22 ENCOUNTER — Encounter: Payer: Self-pay | Admitting: Internal Medicine

## 2021-05-22 HISTORY — PX: FLEXIBLE SIGMOIDOSCOPY: SHX5431

## 2021-05-22 LAB — CBC WITH DIFFERENTIAL/PLATELET
Abs Immature Granulocytes: 0.01 10*3/uL (ref 0.00–0.07)
Basophils Absolute: 0 10*3/uL (ref 0.0–0.1)
Basophils Relative: 1 %
Eosinophils Absolute: 0.4 10*3/uL (ref 0.0–0.5)
Eosinophils Relative: 7 %
HCT: 29.5 % — ABNORMAL LOW (ref 36.0–46.0)
Hemoglobin: 9.4 g/dL — ABNORMAL LOW (ref 12.0–15.0)
Immature Granulocytes: 0 %
Lymphocytes Relative: 43 %
Lymphs Abs: 2.3 10*3/uL (ref 0.7–4.0)
MCH: 26.3 pg (ref 26.0–34.0)
MCHC: 31.9 g/dL (ref 30.0–36.0)
MCV: 82.6 fL (ref 80.0–100.0)
Monocytes Absolute: 0.5 10*3/uL (ref 0.1–1.0)
Monocytes Relative: 9 %
Neutro Abs: 2.1 10*3/uL (ref 1.7–7.7)
Neutrophils Relative %: 40 %
Platelets: 173 10*3/uL (ref 150–400)
RBC: 3.57 MIL/uL — ABNORMAL LOW (ref 3.87–5.11)
RDW: 13.2 % (ref 11.5–15.5)
WBC: 5.3 10*3/uL (ref 4.0–10.5)
nRBC: 0 % (ref 0.0–0.2)

## 2021-05-22 LAB — BASIC METABOLIC PANEL
Anion gap: 2 — ABNORMAL LOW (ref 5–15)
BUN: 10 mg/dL (ref 6–20)
CO2: 26 mmol/L (ref 22–32)
Calcium: 8.2 mg/dL — ABNORMAL LOW (ref 8.9–10.3)
Chloride: 114 mmol/L — ABNORMAL HIGH (ref 98–111)
Creatinine, Ser: 0.84 mg/dL (ref 0.44–1.00)
GFR, Estimated: >60 mL/min (ref >60)
Glucose, Bld: 82 mg/dL (ref 70–99)
Potassium: 4.6 mmol/L (ref 3.5–5.1)
Sodium: 142 mmol/L (ref 135–145)

## 2021-05-22 SURGERY — SIGMOIDOSCOPY, FLEXIBLE
Anesthesia: General

## 2021-05-22 MED ORDER — PROPOFOL 500 MG/50ML IV EMUL: INTRAVENOUS | Status: DC | PRN

## 2021-05-22 MED ORDER — PROPOFOL 500 MG/50ML IV EMUL
INTRAVENOUS | Status: AC
  Filled 2021-05-22: qty 50

## 2021-05-22 MED ORDER — PROPOFOL 500 MG/50ML IV EMUL
INTRAVENOUS | Status: DC | PRN
  Administered 2021-05-22: 60 mg via INTRAVENOUS

## 2021-05-22 MED ORDER — IOHEXOL 300 MG/ML  SOLN
100.0000 mL | Freq: Once | INTRAMUSCULAR | Status: AC | PRN
  Administered 2021-05-22: 100 mL via INTRAVENOUS

## 2021-05-22 MED ORDER — PROPOFOL 10 MG/ML IV BOLUS
INTRAVENOUS | Status: DC | PRN
  Administered 2021-05-22: 125 ug/kg/min via INTRAVENOUS

## 2021-05-22 MED ORDER — MAGNESIUM HYDROXIDE 400 MG/5ML PO SUSP
15.0000 mL | Freq: Four times a day (QID) | ORAL | Status: DC | PRN
  Filled 2021-05-22: qty 30

## 2021-05-22 MED ORDER — IOHEXOL 9 MG/ML PO SOLN
500.0000 mL | ORAL | Status: AC
  Administered 2021-05-22 (×2): 500 mL via ORAL

## 2021-05-22 NOTE — Anesthesia Preprocedure Evaluation (Signed)
Anesthesia Evaluation  Patient identified by MRN, date of birth, ID band Patient awake    Reviewed: Allergy & Precautions, NPO status , Patient's Chart, lab work & pertinent test results  History of Anesthesia Complications Negative for: history of anesthetic complications  Airway Mallampati: II  TM Distance: >3 FB Neck ROM: Full    Dental  (+) Upper Dentures, Edentulous Lower   Pulmonary asthma , COPD (uses O2 at night), Current Smoker and Patient abstained from smoking.,    breath sounds clear to auscultation- rhonchi (-) wheezing      Cardiovascular hypertension, Pt. on medications (-) CAD, (-) Past MI, (-) Cardiac Stents and (-) CABG  Rhythm:Regular Rate:Normal - Systolic murmurs and - Diastolic murmurs    Neuro/Psych neg Seizures negative neurological ROS  negative psych ROS   GI/Hepatic negative GI ROS, Neg liver ROS,   Endo/Other  negative endocrine ROSneg diabetes  Renal/GU negative Renal ROS     Musculoskeletal negative musculoskeletal ROS (+)   Abdominal (+) + obese,   Peds  Hematology negative hematology ROS (+)   Anesthesia Other Findings Past Medical History: No date: Asthma No date: Cancer ([place]) No date: COPD (chronic obstructive pulmonary disease) (HCC) No date: Hypertension   Reproductive/Obstetrics                             Anesthesia Physical Anesthesia Plan  ASA: 2  Anesthesia Plan: General   Post-op Pain Management:    Induction: Intravenous  PONV Risk Score and Plan: 1 and Propofol infusion  Airway Management Planned: Natural Airway  Additional Equipment:   Intra-op Plan:   Post-operative Plan:   Informed Consent: I have reviewed the patients History and Physical, chart, labs and discussed the procedure including the risks, benefits and alternatives for the proposed anesthesia with the patient or authorized representative who has indicated his/her  understanding and acceptance.     Dental advisory given  Plan Discussed with: CRNA and Anesthesiologist  Anesthesia Plan Comments:         Anesthesia Quick Evaluation

## 2021-05-22 NOTE — Progress Notes (Signed)
Mobility Specialist - Progress Note   05/22/21 1400  Mobility  Activity Off unit  Mobility performed by Mobility specialist    Pt off unit at this time for procedure. Will attempt session another date/time as pt becomes available.    Kathee Delton Mobility Specialist 05/22/21, 2:06 PM

## 2021-05-22 NOTE — Progress Notes (Addendum)
Pt reported she only pass small amount of stool after the enema. Informed pt that I will message the oncall provider for needed rpt enema but pt refused and does not want anything right now as she was wants to sleep.

## 2021-05-22 NOTE — Anesthesia Postprocedure Evaluation (Signed)
Anesthesia Post Note  Patient: Anna Cruz  Procedure(s) Performed: [place]  Patient location during evaluation: Endoscopy Anesthesia Type: General Level of consciousness: awake and alert and oriented Pain management: pain level controlled Vital Signs Assessment: post-procedure vital signs reviewed and stable Respiratory status: spontaneous breathing, nonlabored ventilation and respiratory function stable Cardiovascular status: blood pressure returned to baseline and stable Postop Assessment: no signs of nausea or vomiting Anesthetic complications: no   No notable events documented.   Last Vitals:  Vitals:   05/22/21 1438 05/22/21 1446  BP: 123/81 128/80  Pulse: 75 73  Resp: (!) 21 15  Temp:    SpO2: 93% 96%    Last Pain:  Vitals:   05/22/21 1446  TempSrc:   PainSc: 0-No pain                 Raylon Lamson

## 2021-05-22 NOTE — Care Plan (Signed)
Patient with flex sig that was remarkable for no stool until what was though to be ascending colon (clip placed). Will plan on repeat CT scan to rule out a SBO and if negative. Will plan on EGD potentially tomorrow.  Raylene Miyamoto MD, MPH [facility]

## 2021-05-22 NOTE — Progress Notes (Signed)
PROGRESS NOTE  Anna Cruz YSA:[id number] DOB: June 06, 1961 DOA: 05/18/2021 PCP: [facility], [facility]  HPI/Recap of past 24 hours: Anna Cruz is a 60 y.o. female with medical history significant for HTN, HLD, depression/anxiety, tobacco abuse, GERD, COPD, constipation, history of alcohol abuse, presents to the ED with chief concerns of abdominal pain for the past 3 weeks. Reports no BM for 2 weeks, and that's not normal for her. She endorses nausea and vomiting. Reports that she has tried multiple stool softeners over the course of the last 2 weeks without any improvements. Pt endorses 45 pound weight gain in 6 months. Last EtOH drink was Apr 12, 2020. Currently staying at a female residential home for recovering addicts, [facility]. Pt is on 3L of O2 at baseline.  GI consulted, patient admitted for further management.    Patient still with liquid stool with no BM.      Assessment/Plan: Principal Problem:   Abdominal pain Active Problems:   AKI (acute kidney injury) ([place])   Constipation   Hyperlipidemia   Tobacco abuse   COPD with chronic bronchitis (HCC)   Hypokalemia   Abdominal pain-in setting of ??chronic constipation No bowel movement for 2 to 3 weeks GI consulted, tried multiple enema, GoLytely, disimpaction, suppository with no result. Flex sig was unremarkable for stool until the ascending colon CT abdomen/pelvis to rule out SBO pending, if negative, plan for EGD Clear liquid diet, n.p.o. at midnight Continue IV fluids   AKI Resolved Likely 2/2 poor p.o. intake  Continue IV fluids Daily BMP  Hypokalemia Replace as needed  Hypertension BP soft Hold home hydrochlorothiazide Continue IV fluids  Iron deficiency anemia Baseline hemoglobin normal, dropped to 10.5 likely 2/2 hemodilution No evidence of bleeding Daily CBC  Hyperlipidemia Continue atorvastatin 20 mg daily  COPD/chronic hypoxic respiratory failure Stable at baseline  O2, 3 L, saturating well Continue inhalers, duo nebs as needed, Singulair  Depression/anxiety Continue Celexa 20 mg daily, fluoxetine 40 mg twice daily, Atarax as needed  Insomnia Continue quetiapine 600 mg p.o. nightly, trazodone 200 mg daily nightly, zolpidem 10 mg nightly   History of alcohol abuse Patient has been sober since May 2021 Previously drank about 2 bottle of Shearon Stalls daily  Tobacco abuse Advised to quit Nicotine patch  Obesity Lifestyle modification advised      Estimated body mass index is 30.42 kg/m as calculated from the following:   Height as of this encounter: 5\' 1"  (1.549 m).   Weight as of this encounter: 73 kg.     Code Status: Full  Family Communication: None at bedside  Disposition Plan: Status is: Inpatient  The patient will require care spanning > 2 midnights and should be moved to inpatient because: Inpatient level of care appropriate due to severity of illness  Dispo: The patient is from: Group home              Anticipated d/c is to: Group home              Patient currently is not medically stable to d/c.   Difficult to place patient No    Consultants: GI  Procedures: Flex sig on 05/22/21  Antimicrobials: None  DVT prophylaxis: Lovenox   Objective: Vitals:   05/22/21 1428 05/22/21 1438 05/22/21 1446 05/22/21 1531  BP: 113/73 123/81 128/80 (!) 141/85  Pulse: 82 75 73 70  Resp: 17 (!) 21 15 16   Temp:    98.2 F (36.8 C)  TempSrc:    Oral  SpO2: 95% 93% 96% 93%  Weight:      Height:        Intake/Output Summary (Last 24 hours) at 05/22/2021 1653 Last data filed at 05/22/2021 0105 Gross per 24 hour  Intake 0 ml  Output --  Net 0 ml   Filed Weights   05/17/21 2140 05/18/21 0138  Weight: 71 kg 73 kg    Exam: General: NAD  Cardiovascular: S1, S2 present Respiratory: CTAB Abdomen: Soft, nontender, mildly distended, bowel sounds present Musculoskeletal: No bilateral pedal edema noted Skin:  Normal Psychiatry: Normal mood    Data Reviewed: CBC: Recent Labs  Lab 05/17/21 1432 05/17/21 2153 05/19/21 0444 05/20/21 0529 05/21/21 0634 05/22/21 0431  WBC 7.7 7.7 5.1 5.7 5.5 5.3  NEUTROABS 4.5  --   --  2.4 2.2 2.1  HGB 13.5 13.0 10.5* 10.0* 9.2* 9.4*  HCT 40.2 39.1 31.3* 30.6* 28.9* 29.5*  MCV 80 79.8* 81.1 80.7 82.8 82.6  PLT 272 255 192 195 169 749   Basic Metabolic Panel: Recent Labs  Lab 05/18/21 1544 05/19/21 0444 05/20/21 0529 05/21/21 0634 05/22/21 0431  NA 134* 135 138 140 142  K 3.0* 3.7 4.0 4.3 4.6  CL 93* 97* 101 109 114*  CO2 33* 31 32 27 26  GLUCOSE 97 80 95 119* 82  BUN 23* 23* 22* 16 10  CREATININE 1.13* 1.07* 1.02* 0.97 0.84  CALCIUM 8.9 8.7* 8.6* 8.1* 8.2*   GFR: Estimated Creatinine Clearance: 66.7 mL/min (by C-G formula based on SCr of 0.84 mg/dL). Liver Function Tests: Recent Labs  Lab 05/17/21 1432 05/17/21 2153  AST 35 39  ALT 38* 39  ALKPHOS 153* 120  BILITOT 0.4 0.8  PROT 7.2 7.7  ALBUMIN 5.0* 4.6   Recent Labs  Lab 05/17/21 2153  LIPASE 35   No results for input(s): AMMONIA in the last 168 hours. Coagulation Profile: No results for input(s): INR, PROTIME in the last 168 hours. Cardiac Enzymes: No results for input(s): CKTOTAL, CKMB, CKMBINDEX, TROPONINI in the last 168 hours. BNP (last 3 results) No results for input(s): PROBNP in the last 8760 hours. HbA1C: No results for input(s): HGBA1C in the last 72 hours. CBG: No results for input(s): GLUCAP in the last 168 hours. Lipid Profile: No results for input(s): CHOL, HDL, LDLCALC, TRIG, CHOLHDL, LDLDIRECT in the last 72 hours. Thyroid Function Tests: No results for input(s): TSH, T4TOTAL, FREET4, T3FREE, THYROIDAB in the last 72 hours. Anemia Panel: No results for input(s): VITAMINB12, FOLATE, FERRITIN, TIBC, IRON, RETICCTPCT in the last 72 hours. Urine analysis:    Component Value Date/Time   COLORURINE YELLOW (A) 05/17/2021 2144   APPEARANCEUR CLEAR (A)  05/17/2021 2144   LABSPEC 1.013 05/17/2021 2144   PHURINE 7.0 05/17/2021 2144   GLUCOSEU NEGATIVE 05/17/2021 2144   HGBUR NEGATIVE 05/17/2021 2144   BILIRUBINUR NEGATIVE 05/17/2021 2144   KETONESUR NEGATIVE 05/17/2021 2144   PROTEINUR NEGATIVE 05/17/2021 2144   NITRITE NEGATIVE 05/17/2021 2144   LEUKOCYTESUR NEGATIVE 05/17/2021 2144   Sepsis Labs: @LABRCNTIP (procalcitonin:4,lacticidven:4)  ) Recent Results (from the past 240 hour(s))  Resp Panel by RT-PCR (Flu A&B, Covid) Nasopharyngeal Swab     Status: None   Collection Time: 05/18/21  9:19 AM   Specimen: Nasopharyngeal Swab; Nasopharyngeal(NP) swabs in vial transport medium  Result Value Ref Range Status   SARS Coronavirus 2 by RT PCR NEGATIVE NEGATIVE Final    Comment: (NOTE) SARS-CoV-2 target nucleic acids are NOT DETECTED.  The SARS-CoV-2 RNA is generally detectable in  upper respiratory specimens during the acute phase of infection. The lowest concentration of SARS-CoV-2 viral copies this assay can detect is 138 copies/mL. A negative result does not preclude SARS-Cov-2 infection and should not be used as the sole basis for treatment or other patient management decisions. A negative result may occur with  improper specimen collection/handling, submission of specimen other than nasopharyngeal swab, presence of viral mutation(s) within the areas targeted by this assay, and inadequate number of viral copies(<138 copies/mL). A negative result must be combined with clinical observations, patient history, and epidemiological information. The expected result is Negative.  Fact Sheet for Patients:  EntrepreneurPulse.com.au  Fact Sheet for Healthcare Providers:  IncredibleEmployment.be  This test is no t yet approved or cleared by the [place] FDA and  has been authorized for detection and/or diagnosis of SARS-CoV-2 by FDA under an Emergency Use Authorization (EUA). This EUA will remain   in effect (meaning this test can be used) for the duration of the COVID-19 declaration under Section 564(b)(1) of the Act, 21 U.S.C.section 360bbb-3(b)(1), unless the authorization is terminated  or revoked sooner.       Influenza A by PCR NEGATIVE NEGATIVE Final   Influenza B by PCR NEGATIVE NEGATIVE Final    Comment: (NOTE) The Xpert Xpress SARS-CoV-2/FLU/RSV plus assay is intended as an aid in the diagnosis of influenza from Nasopharyngeal swab specimens and should not be used as a sole basis for treatment. Nasal washings and aspirates are unacceptable for Xpert Xpress SARS-CoV-2/FLU/RSV testing.  Fact Sheet for Patients: EntrepreneurPulse.com.au  Fact Sheet for Healthcare Providers: IncredibleEmployment.be  This test is not yet approved or cleared by the [place] FDA and has been authorized for detection and/or diagnosis of SARS-CoV-2 by FDA under an Emergency Use Authorization (EUA). This EUA will remain in effect (meaning this test can be used) for the duration of the COVID-19 declaration under Section 564(b)(1) of the Act, 21 U.S.C. section 360bbb-3(b)(1), unless the authorization is terminated or revoked.  Performed at [facility] [place] [facility], [address], [place], [place] [address]       Studies: No results found.  Scheduled Meds:  atorvastatin  20 mg Oral Daily   citalopram  20 mg Oral Daily   cyanocobalamin  1,000 mcg Intramuscular Q30 days   enoxaparin (LOVENOX) injection  40 mg Subcutaneous QHS   FLUoxetine  40 mg Oral BID   fluticasone  1 spray Each Nare Daily   loratadine  10 mg Oral Daily   mometasone-formoterol  2 puff Inhalation BID   montelukast  10 mg Oral Daily   nicotine  21 mg Transdermal Daily   prazosin  2 mg Oral QHS   QUEtiapine  600 mg Oral QHS   tiotropium  18 mcg Inhalation Daily   traZODone  200 mg Oral QHS   zolpidem  10 mg Oral QHS    Continuous Infusions:  sodium chloride 100 mL/hr  at 05/22/21 1348     LOS: 3 days     Alma Friendly, MD [facility]  If 7PM-7AM, please contact night-coverage [email or web address] 05/22/2021, 4:53 PM

## 2021-05-22 NOTE — Op Note (Signed)
[facility] Gastroenterology Patient Name: Anna Cruz Procedure Date: 05/22/2021 12:14 PM MRN: [id number] Account #: [id number] Date of Birth: 02-Apr-1961 Admit Type: Outpatient Age: 60 Room: [facility] ENDO ROOM 1 Gender: Female Note Status: Finalized Procedure:             Colonoscopy Indications:           Obstipation Providers:             Andrey Farmer MD, MD Referring MD:          [place], MD (Referring MD) Medicines:             Monitored Anesthesia Care Complications:         No immediate complications. Procedure:             Pre-Anesthesia Assessment:                        - Prior to the procedure, a History and Physical was                         performed, and patient medications and allergies were                         reviewed. The patient is competent. The risks and                         benefits of the procedure and the sedation options and                         risks were discussed with the patient. All questions                         were answered and informed consent was obtained.                         Patient identification and proposed procedure were                         verified by the physician, the nurse, the anesthetist                         and the technician in the endoscopy suite. Mental                         Status Examination: normal. Airway Examination: normal                         oropharyngeal airway and neck mobility. Respiratory                         Examination: clear to auscultation. CV Examination:                         normal. Prophylactic Antibiotics: The patient does not                         require prophylactic antibiotics. Prior  Anticoagulants: The patient has taken no previous                         anticoagulant or antiplatelet agents. ASA Grade                         Assessment: II - A patient with mild systemic disease.                          After reviewing the risks and benefits, the patient                         was deemed in satisfactory condition to undergo the                         procedure. The anesthesia plan was to use monitored                         anesthesia care (MAC). Immediately prior to                         administration of medications, the patient was                         re-assessed for adequacy to receive sedatives. The                         heart rate, respiratory rate, oxygen saturations,                         blood pressure, adequacy of pulmonary ventilation, and                         response to care were monitored throughout the                         procedure. The physical status of the patient was                         re-assessed after the procedure.                        After obtaining informed consent, the colonoscope was                         passed under direct vision. Throughout the procedure,                         the patient's blood pressure, pulse, and oxygen                         saturations were monitored continuously. The                         Colonoscope was introduced through the anus and                         advanced to the the ascending colon. After obtaining  informed consent, the colonoscope was passed under                         direct vision. Throughout the procedure, the patient's                         blood pressure, pulse, and oxygen saturations were                         monitored continuously.The colonoscopy was performed                         without difficulty. The patient tolerated the                         procedure well. The quality of the bowel preparation                         was good except the ascending colon was poor. Findings:      The perianal and digital rectal examinations were normal.      No findings of stricture noted. Was initially a flex sig but appeared to       make it to  ascending colon. One clip was placed at what was thought to       be the ileocecal valve for follow-up imaging.      There is no endoscopic evidence of stricture in the entire colon.      A 6 mm polyp was found in the descending colon. The polyp was       semi-pedunculated. Polypectomy was not attempted. Impression:            - One 6 mm polyp in the descending colon. Resection                         not attempted.                        - No specimens collected. Recommendation:        - Return patient to hospital ward for ongoing care.                        - Perform CT scan (computed tomography) of the abdomen                         with contrast today.                        - Clear liquid diet.                        - Continue present medications. Procedure Code(s):     --- Professional ---                        [phone], 53, Colonoscopy, flexible; diagnostic,                         including collection of specimen(s) by brushing or  washing, when performed (separate procedure) Diagnosis Code(s):     --- Professional ---                        K63.5, Polyp of colon                        K59.00, Constipation, unspecified CPT copyright 2019 [employer]. All rights reserved. The codes documented in this report are preliminary and upon coder review may  be revised to meet current compliance requirements. Andrey Farmer MD, MD 05/22/2021 2:24:40 PM Number of Addenda: 0 Note Initiated On: 05/22/2021 12:14 PM Scope Withdrawal Time: 0 hours 4 minutes 30 seconds  Total Procedure Duration: 0 hours 16 minutes 58 seconds  Estimated Blood Loss:  Estimated blood loss: none. Estimated blood loss: none.      [facility]

## 2021-05-22 NOTE — Transfer of Care (Signed)
Immediate Anesthesia Transfer of Care Note  Patient: Anna Cruz  Procedure(s) Performed: FLEXIBLE SIGMOIDOSCOPY  Patient Location: PACU  Anesthesia Type:General  Level of Consciousness: awake and alert   Airway & Oxygen Therapy: Patient Spontanous Breathing and Patient connected to nasal cannula oxygen  Post-op Assessment: Report given to RN and Post -op Vital signs reviewed and stable  Post vital signs: Reviewed and stable  Last Vitals:  Vitals Value Taken Time  BP 109/71 05/22/21 1418  Temp 36.7 C 05/22/21 1418  Pulse 79 05/22/21 1419  Resp 29 05/22/21 1419  SpO2 94 % 05/22/21 1419  Vitals shown include unvalidated device data.  Last Pain:  Vitals:   05/22/21 1418  TempSrc: Temporal  PainSc: Asleep         Complications: No notable events documented.

## 2021-05-23 ENCOUNTER — Inpatient Hospital Stay: Payer: Medicaid Other | Admitting: Anesthesiology

## 2021-05-23 ENCOUNTER — Encounter
Admission: EM | Disposition: A | Discharge status: Home / Self Care | Payer: Self-pay | Source: Home / Self Care | Attending: Internal Medicine

## 2021-05-23 ENCOUNTER — Encounter: Payer: Self-pay | Admitting: Internal Medicine

## 2021-05-23 ENCOUNTER — Other Ambulatory Visit: Payer: Self-pay

## 2021-05-23 HISTORY — PX: ESOPHAGOGASTRODUODENOSCOPY (EGD) WITH PROPOFOL: SHX5813

## 2021-05-23 LAB — CBC WITH DIFFERENTIAL/PLATELET
Abs Immature Granulocytes: 0.01 10*3/uL (ref 0.00–0.07)
Basophils Absolute: 0 10*3/uL (ref 0.0–0.1)
Basophils Relative: 1 %
Eosinophils Absolute: 0.4 10*3/uL (ref 0.0–0.5)
Eosinophils Relative: 7 %
HCT: 29.6 % — ABNORMAL LOW (ref 36.0–46.0)
Hemoglobin: 9.5 g/dL — ABNORMAL LOW (ref 12.0–15.0)
Immature Granulocytes: 0 %
Lymphocytes Relative: 38 %
Lymphs Abs: 2 10*3/uL (ref 0.7–4.0)
MCH: 26.3 pg (ref 26.0–34.0)
MCHC: 32.1 g/dL (ref 30.0–36.0)
MCV: 82 fL (ref 80.0–100.0)
Monocytes Absolute: 0.5 10*3/uL (ref 0.1–1.0)
Monocytes Relative: 10 %
Neutro Abs: 2.3 10*3/uL (ref 1.7–7.7)
Neutrophils Relative %: 44 %
Platelets: 177 10*3/uL (ref 150–400)
RBC: 3.61 MIL/uL — ABNORMAL LOW (ref 3.87–5.11)
RDW: 13.5 % (ref 11.5–15.5)
WBC: 5.1 10*3/uL (ref 4.0–10.5)
nRBC: 0 % (ref 0.0–0.2)

## 2021-05-23 LAB — BASIC METABOLIC PANEL
Anion gap: 2 — ABNORMAL LOW (ref 5–15)
BUN: 8 mg/dL (ref 6–20)
CO2: 25 mmol/L (ref 22–32)
Calcium: 8.6 mg/dL — ABNORMAL LOW (ref 8.9–10.3)
Chloride: 114 mmol/L — ABNORMAL HIGH (ref 98–111)
Creatinine, Ser: 0.87 mg/dL (ref 0.44–1.00)
GFR, Estimated: >60 mL/min (ref >60)
Glucose, Bld: 77 mg/dL (ref 70–99)
Potassium: 4.8 mmol/L (ref 3.5–5.1)
Sodium: 141 mmol/L (ref 135–145)

## 2021-05-23 SURGERY — ESOPHAGOGASTRODUODENOSCOPY (EGD) WITH PROPOFOL
Anesthesia: General

## 2021-05-23 MED ORDER — SODIUM CHLORIDE 0.9 % IV SOLN: INTRAVENOUS | Status: DC

## 2021-05-23 MED ORDER — PROPOFOL 500 MG/50ML IV EMUL
INTRAVENOUS | Status: DC | PRN
  Administered 2021-05-23: 200 ug/kg/min via INTRAVENOUS

## 2021-05-23 MED ORDER — PROPOFOL 10 MG/ML IV BOLUS: INTRAVENOUS | Status: DC | PRN

## 2021-05-23 MED ORDER — FERROUS SULFATE 325 (65 FE) MG PO TABS
325.0000 mg | ORAL_TABLET | Freq: Every day | ORAL | 1 refills | Status: DC
Start: 2021-06-06 — End: 2021-12-26

## 2021-05-23 MED ORDER — PROPOFOL 10 MG/ML IV BOLUS
INTRAVENOUS | Status: DC | PRN
  Administered 2021-05-23: 80 mg via INTRAVENOUS
  Administered 2021-05-23: 10 mg via INTRAVENOUS

## 2021-05-23 NOTE — Plan of Care (Signed)
  Problem: Education: Goal: Knowledge of General Education information will improve Description: Including pain rating scale, medication(s)/side effects and non-pharmacologic comfort measures 05/23/2021 1345 by Milinda Hirschfeld, RN Outcome: Adequate for Discharge 05/23/2021 0739 by Milinda Hirschfeld, RN Outcome: Progressing   Problem: Health Behavior/Discharge Planning: Goal: Ability to manage health-related needs will improve 05/23/2021 1345 by Milinda Hirschfeld, RN Outcome: Adequate for Discharge 05/23/2021 0739 by Milinda Hirschfeld, RN Outcome: Progressing   Problem: Clinical Measurements: Goal: Ability to maintain clinical measurements within normal limits will improve 05/23/2021 1345 by Milinda Hirschfeld, RN Outcome: Adequate for Discharge 05/23/2021 0739 by Milinda Hirschfeld, RN Outcome: Progressing Goal: Will remain free from infection 05/23/2021 1345 by Milinda Hirschfeld, RN Outcome: Adequate for Discharge 05/23/2021 0739 by Milinda Hirschfeld, RN Outcome: Progressing Goal: Diagnostic test results will improve 05/23/2021 1345 by Milinda Hirschfeld, RN Outcome: Adequate for Discharge 05/23/2021 0739 by Milinda Hirschfeld, RN Outcome: Progressing Goal: Respiratory complications will improve 05/23/2021 1345 by Milinda Hirschfeld, RN Outcome: Adequate for Discharge 05/23/2021 0739 by Milinda Hirschfeld, RN Outcome: Progressing Goal: Cardiovascular complication will be avoided 05/23/2021 1345 by Milinda Hirschfeld, RN Outcome: Adequate for Discharge 05/23/2021 0739 by Milinda Hirschfeld, RN Outcome: Progressing   Problem: Activity: Goal: Risk for activity intolerance will decrease 05/23/2021 1345 by Milinda Hirschfeld, RN Outcome: Adequate for Discharge 05/23/2021 0739 by Milinda Hirschfeld, RN Outcome: Progressing   Problem: Nutrition: Goal: Adequate nutrition will be maintained 05/23/2021 1345 by Milinda Hirschfeld, RN Outcome: Adequate for Discharge 05/23/2021 0739 by Milinda Hirschfeld, RN Outcome: Progressing   Problem:  Coping: Goal: Level of anxiety will decrease 05/23/2021 1345 by Milinda Hirschfeld, RN Outcome: Adequate for Discharge 05/23/2021 0739 by Milinda Hirschfeld, RN Outcome: Progressing   Problem: Elimination: Goal: Will not experience complications related to bowel motility 05/23/2021 1345 by Milinda Hirschfeld, RN Outcome: Adequate for Discharge 05/23/2021 0739 by Milinda Hirschfeld, RN Outcome: Progressing Goal: Will not experience complications related to urinary retention 05/23/2021 1345 by Milinda Hirschfeld, RN Outcome: Adequate for Discharge 05/23/2021 0739 by Milinda Hirschfeld, RN Outcome: Progressing   Problem: Pain Managment: Goal: General experience of comfort will improve 05/23/2021 1345 by Milinda Hirschfeld, RN Outcome: Adequate for Discharge 05/23/2021 0739 by Milinda Hirschfeld, RN Outcome: Progressing   Problem: Safety: Goal: Ability to remain free from injury will improve 05/23/2021 1345 by Milinda Hirschfeld, RN Outcome: Adequate for Discharge 05/23/2021 0739 by Milinda Hirschfeld, RN Outcome: Progressing   Problem: Skin Integrity: Goal: Risk for impaired skin integrity will decrease 05/23/2021 1345 by Milinda Hirschfeld, RN Outcome: Adequate for Discharge 05/23/2021 0739 by Milinda Hirschfeld, RN Outcome: Progressing

## 2021-05-23 NOTE — Anesthesia Postprocedure Evaluation (Signed)
Anesthesia Post Note  Patient: Anna Cruz  Procedure(s) Performed: ESOPHAGOGASTRODUODENOSCOPY (EGD) WITH PROPOFOL  Patient location during evaluation: Endoscopy Anesthesia Type: General Level of consciousness: awake and alert Pain management: pain level controlled Vital Signs Assessment: post-procedure vital signs reviewed and stable Respiratory status: spontaneous breathing, nonlabored ventilation, respiratory function stable and patient connected to nasal cannula oxygen Cardiovascular status: blood pressure returned to baseline and stable Postop Assessment: no apparent nausea or vomiting Anesthetic complications: no   No notable events documented.   Last Vitals:  Vitals:   05/23/21 1126 05/23/21 1136  BP: 122/81 123/80  Pulse: 76 73  Resp: 17 17  Temp:    SpO2: 93% 94%    Last Pain:  Vitals:   05/23/21 1136  TempSrc:   PainSc: 0-No pain                 Precious Haws [name]

## 2021-05-23 NOTE — Transfer of Care (Signed)
Immediate Anesthesia Transfer of Care Note  Patient: Anna Cruz  Procedure(s) Performed: Procedure(s): ESOPHAGOGASTRODUODENOSCOPY (EGD) WITH PROPOFOL (N/A)  Patient Location: PACU and Endoscopy Unit  Anesthesia Type:General  Level of Consciousness: sedated  Airway & Oxygen Therapy: Patient Spontanous Breathing and Patient connected to nasal cannula oxygen  Post-op Assessment: Report given to RN and Post -op Vital signs reviewed and stable  Post vital signs: Reviewed and stable  Last Vitals:  Vitals:   05/23/21 1003 05/23/21 1106  BP: (!) 129/91 109/72  Pulse: 79 73  Resp: 20 (!) 22  Temp: (!) 36.2 C (!) 36.2 C  SpO2: 10% 31%    Complications: No apparent anesthesia complications

## 2021-05-23 NOTE — Discharge Summary (Signed)
Physician Discharge Summary  Anna Cruz ZHG:[id number] DOB: 23-Aug-1961 DOA: 05/18/2021  [facility] [place] date: 05/18/2021 Discharge date: 05/23/2021  Time spent: 40 minutes  Recommendations for Outpatient Follow-up:  Follow outpatient CBC/CMP Follow GI outpatient Follow GI symptoms/constipation Follow bilateral effusions, trace LE edema on lasix - adjust prn  Consider echo outpatient   Discharge Diagnoses:  Principal Problem:   Abdominal pain Active Problems:   AKI (acute kidney injury) ([place])   Constipation   Hyperlipidemia   Tobacco abuse   COPD with chronic bronchitis ([place])   Hypokalemia   Discharge Condition: stable  Diet recommendation: heart healthy  Filed Weights   05/17/21 2140 05/18/21 0138  Weight: 71 kg 73 kg    History of present illness:  Anna Cruz is Anna Cruz 60 y.o. female with medical history significant for HTN, HLD, depression/anxiety, tobacco abuse, GERD, COPD, constipation, history of alcohol abuse, presents to the ED with chief concerns of abdominal pain for the past 3 weeks. Reports no BM for 2 weeks, and that's not normal for her. She endorses nausea and vomiting. Reports that she has tried multiple stool softeners over the course of the last 2 weeks without any improvements. Pt endorses 45 pound weight gain in 6 months. Last EtOH drink was Apr 12, 2020. Currently [facility] at [facility]. Pt is on 3L of O2 at baseline.  GI consulted, patient admitted for further management.  She was seen by GI and aggressive bowel regimen attempted without notable success.  She's s/p EGD and colonscopy which were unrevealing.  Repeat CT showed no acute findings.  GI ok with discharge, plan for discharge with outpatient GI follow up.  Return precautions.    Hospital Course:  Abdominal pain-in setting of ??chronic constipation No bowel movement for 2 to 3 weeks GI consulted, tried  multiple enema, GoLytely, disimpaction, suppository with no result. Flex sig and EGD without notable findings (EGD with hiatal hernia, colonoscopy with polyp).  Flex sig without stool until ascending colon.  Repeat CT abd/pelvis 6/28 without acute intra abdominal or pelvic pathology Cleared for discharge by GI, discussed return precautions - ok to resume regular diet, if she doesn't pass gas within 3 days she should call GI on call.   Resume linzess   Bilateral Pleural Effusions  Trace LE Edema Follow CXR outpatient  Follow on lasix Consider echo outpatient   AKI Resolved Likely 2/2 poor p.o. intake Continue IV fluids Daily BMP   Hypokalemia Replace as needed Improved, hold replacement at discharge   Hypertension Resume lasix, stop HCTZ at discharge   Iron deficiency anemia Relatively stable, follow  Hold iron until constipation resolved  Hyperlipidemia Continue atorvastatin 20 mg daily   COPD/chronic hypoxic respiratory failure Stable at baseline O2, 3 L, saturating well on RA this AM Continue inhalers, duo nebs as needed, Singulair  Depression/anxiety Continue Celexa 20 mg daily, fluoxetine 40 mg twice daily, Atarax as needed  Insomnia Continue quetiapine 600 mg p.o. nightly, trazodone 200 mg daily nightly, zolpidem 10 mg nightly   History of alcohol abuse Patient has been sober since May 2021 Previously drank about 2 bottle of Shearon Stalls daily   Tobacco abuse Advised to quit Nicotine patch   Obesity Lifestyle modification advised  Procedures: EGD - 6 cm hiatal hernia. - Normal esophagus. - Normal stomach. - Normal examined duodenum. - No specimens collected. Impression: - Return patient to hospital ward for possible discharge same day. - Resume regular  diet. - Continue present medications. - Return to GI clinic as previously scheduled.  Colonoscopy - One 6 mm polyp in the descending colon. Resection not attempted. - No specimens  collected. Impression: - Return patient to hospital ward for ongoing care. - Perform CT scan (computed tomography) of the abdomen with contrast today. - Clear liquid diet. - Continue present medications.   Consultations: GI  Discharge Exam: Vitals:   05/23/21 1126 05/23/21 1136  BP: 122/81 123/80  Pulse: 76 73  Resp: 17 17  Temp:    SpO2: 93% 94%   C/o continued bloating Abdominal discomfort overall improved Still hasn't had BM or passed gas Declines me callling any family after I offer  General: No acute distress. Cardiovascular: Heart sounds show Anna Cruz regular rate, and rhythm.  Lungs: Clear to auscultation bilaterally Abdomen: Soft, distended, mild discomfort to epigastric region Neurological: Alert and oriented 3. Moves all extremities 4. Cranial nerves II through XII grossly intact. Skin: Warm and dry. No rashes or lesions. Extremities: No clubbing or cyanosis. Trace edema.   Discharge Instructions   Discharge Instructions     Call MD for:  difficulty breathing, headache or visual disturbances   Complete by: As directed    Call MD for:  extreme fatigue   Complete by: As directed    Call MD for:  hives   Complete by: As directed    Call MD for:  persistant dizziness or light-headedness   Complete by: As directed    Call MD for:  persistant nausea and vomiting   Complete by: As directed    Call MD for:  redness, tenderness, or signs of infection (pain, swelling, redness, odor or green/yellow discharge around incision site)   Complete by: As directed    Call MD for:  severe uncontrolled pain   Complete by: As directed    Call MD for:  temperature >100.4   Complete by: As directed    Diet - low sodium heart healthy   Complete by: As directed    Discharge instructions   Complete by: As directed    You were seen for abdominal discomfort in the setting of chronic constipation.  Your colonoscopy and endoscopy did not reveal any concerning findings.    Please  resume your linzess and follow up with your gastroenterologist as scheduled on 05/29/2021.  If you don't pass gas in 3 days, please call the on call doctor at [facility].  I've changed your iron to daily from three times Anna Cruz day.  Don't resume your iron until your issues with constipation are improved.   Please stop your HCTZ.  Continue lasix daily.  You'll need repeat labs in about 1 week with your PCP.  You should also repeat [employer] chest x ray to follow the pleural effusions (fluid outside your lungs).  Stop your potassium and follow repeat labs to determine whether to resume or not.  You're on several psychiatric medicines.  Please follow up with your primary prescriber for management.   Return for new, recurrent, or worsening symptoms.  Please ask your PCP to request records from this hospitalization so they know what was done and what the next steps will be.   Increase activity slowly   Complete by: As directed       Allergies as of 05/23/2021   No Known Allergies      Medication List     STOP taking these medications    hydrochlorothiazide 25 MG tablet Commonly known as: HYDRODIURIL  mineral oil enema   Potassium Chloride ER 20 MEQ Tbcr       TAKE these medications    albuterol 108 (90 Base) MCG/ACT inhaler Commonly known as: VENTOLIN HFA Inhale 2 puffs into the lungs every 4 (four) hours as needed for wheezing or shortness of breath.   albuterol (2.5 MG/3ML) 0.083% nebulizer solution Commonly known as: PROVENTIL Take 2.5 mg by nebulization every 4 (four) hours as needed for wheezing or shortness of breath.   atorvastatin 20 MG tablet Commonly known as: LIPITOR Take 20 mg by mouth daily.   citalopram 20 MG tablet Commonly known as: CELEXA Take 20 mg by mouth daily.   cyanocobalamin 1000 MCG/ML injection Commonly known as: (VITAMIN B-12) Inject 1,000 mcg into the muscle every 30 (thirty) days.   docusate sodium 100 MG capsule Commonly known as:  COLACE Take 200 mg by mouth daily.   ferrous sulfate 325 (65 FE) MG tablet Take 1 tablet (325 mg total) by mouth daily with breakfast. Start taking on: June 06, 2021 What changed:  when to take this These instructions start on June 06, 2021. If you are unsure what to do until then, ask your doctor or other care provider.   FLUoxetine 40 MG capsule Commonly known as: PROZAC Take 40 mg by mouth 2 (two) times daily.   fluticasone 50 MCG/ACT nasal spray Commonly known as: FLONASE Place 1 spray into both nostrils daily.   fluticasone-salmeterol 250-50 MCG/ACT Aepb Commonly known as: ADVAIR Inhale 1 puff into the lungs in the morning and at bedtime.   folic acid 1 MG tablet Commonly known as: FOLVITE Take 1 mg by mouth daily.   furosemide 20 MG tablet Commonly known as: LASIX Take 20 mg by mouth daily.   hydrOXYzine 50 MG tablet Commonly known as: ATARAX/VISTARIL Take 100 mg by mouth every 8 (eight) hours as needed for itching or anxiety.   linaclotide 145 MCG Caps capsule Commonly known as: LINZESS Take 145 mcg by mouth daily.   loratadine 10 MG tablet Commonly known as: CLARITIN Take 10 mg by mouth daily.   montelukast 10 MG tablet Commonly known as: SINGULAIR Take 10 mg by mouth daily.   multivitamin tablet Take 1 tablet by mouth daily.   nicotine 21 mg/24hr patch Commonly known as: NICODERM CQ - dosed in mg/24 hours Place 21 mg onto the skin daily.   ondansetron 8 MG tablet Commonly known as: ZOFRAN Take 8 mg by mouth every 8 (eight) hours as needed for nausea.   pantoprazole 40 MG tablet Commonly known as: PROTONIX Take 40 mg by mouth daily.   polyethylene glycol-electrolytes 420 g solution Commonly known as: NuLYTELY Drink one 8 oz glass of mixture every 15 minutes until you finish all of the jug or until you feel empty.   prazosin 1 MG capsule Commonly known as: MINIPRESS Take 2 mg by mouth at bedtime.   QUEtiapine 300 MG tablet Commonly known  as: SEROQUEL Take 600 mg by mouth at bedtime.   tiotropium 18 MCG inhalation capsule Commonly known as: SPIRIVA Place 18 mcg into inhaler and inhale daily.   traZODone 100 MG tablet Commonly known as: DESYREL Take 200 mg by mouth at bedtime.   vitamin C 1000 MG tablet Take 1,000 mg by mouth daily.   zolpidem 10 MG tablet Commonly known as: AMBIEN Take 10 mg by mouth at bedtime.       No Known Allergies    The results of significant diagnostics from this hospitalization (  including imaging, microbiology, ancillary and laboratory) are listed below for reference.    Significant Diagnostic Studies: DG Abdomen 1 View  Result Date: 05/18/2021 CLINICAL DATA:  Constipation.  Vomiting.  Dizziness. EXAM: ABDOMEN - 1 VIEW; ABDOMEN - 2 VIEW COMPARISON:  None. FINDINGS: Nonobstructive bowel gas pattern. Nonspecific air-fluid level within the right mid abdomen. Stool throughout the descending sigmoid colon. Round punctate density overlying the pelvis likely represent phleboliths. There is an oval approximately Manan Olmo 0.8 cm calcific density overlying the right renal shadow that may represent nephrolithiasis. Right upper quadrant surgical clips. Visualized lung bases grossly unremarkable. IMPRESSION: 1. Nonobstructive bowel gas pattern with stool throughout the descending and sigmoid colon. 2. Oval 0.8 cm calcific density overlying the right renal shadow may represent nephrolithiasis. Electronically Signed   By: Iven Finn M.D.   On: 05/18/2021 02:19   CT ABDOMEN PELVIS W CONTRAST  Result Date: 05/22/2021 CLINICAL DATA:  60 year old female with abdominal distension. EXAM: CT ABDOMEN AND PELVIS WITH CONTRAST TECHNIQUE: Multidetector CT imaging of the abdomen and pelvis was performed using the standard protocol following bolus administration of intravenous contrast. CONTRAST:  197mL OMNIPAQUE IOHEXOL 300 MG/ML  SOLN COMPARISON:  CT abdomen pelvis dated 05/18/2021. FINDINGS: Lower chest: Partially  visualized small bilateral pleural effusions. There is partial atelectasis of the lower lobes versus pneumonia. Clinical correlation is recommended. No intra-abdominal free air or free fluid. Hepatobiliary: There is Jaydee Ingman 4 cm cyst in the left lobe of the liver. No intrahepatic biliary ductal dilatation. Cholecystectomy. No retained calcified stone noted in the central CBD. Pancreas: Unremarkable. No pancreatic ductal dilatation or surrounding inflammatory changes. Spleen: Normal in size without focal abnormality. Adrenals/Urinary Tract: The adrenal glands unremarkable. There is no hydronephrosis on either side. There is symmetric enhancement and excretion of contrast by both kidneys. The visualized ureters and urinary bladder appear unremarkable. Stomach/Bowel: There is Edith Lord small hiatal hernia. There is no bowel obstruction or active inflammation. The appendix is normal. Vascular/Lymphatic: Mild aortoiliac atherosclerotic disease. The IVC is unremarkable. No portal venous gas. There is no adenopathy. Reproductive: Hysterectomy. No adnexal masses. Other: Midline vertical anterior abdominal wall incisional scar. Musculoskeletal: No acute or significant osseous findings. IMPRESSION: 1. No acute intra-abdominal or pelvic pathology. No bowel obstruction. Normal appendix. 2. Partially visualized small bilateral pleural effusions with partial atelectasis of the lower lobes versus pneumonia. Clinical correlation is recommended. 3. Aortic Atherosclerosis (ICD10-I70.0). Electronically Signed   By: Anner Crete M.D.   On: 05/22/2021 19:27   CT ABDOMEN PELVIS W CONTRAST  Result Date: 05/18/2021 CLINICAL DATA:  Nausea and vomiting. Worsening constipation. Possible obstruction. EXAM: CT ABDOMEN AND PELVIS WITH CONTRAST TECHNIQUE: Multidetector CT imaging of the abdomen and pelvis was performed using the standard protocol following bolus administration of intravenous contrast. CONTRAST:  61mL OMNIPAQUE IOHEXOL 300 MG/ML  SOLN  COMPARISON:  None. FINDINGS: Lower chest: The lung bases are clear. No pleural effusion or pulmonary lesions. The heart is normal in size. No pericardial effusion. There is Crystallee Werden moderate to large hiatal hernia. Hepatobiliary: Simple left hepatic lobe cyst. No worrisome hepatic lesions or intrahepatic biliary dilatation. The gallbladder is surgically absent. Mild associated common bile duct dilatation measuring 16 mm in the porta hepatis and 9 mm in the head of the pancreas. Pancreas: No mass, inflammation or ductal dilatation. Spleen: Normal size.  No focal lesions. Adrenals/Urinary Tract: The adrenal glands and kidneys are. No renal lesions or hydronephrosis. Bladder is decompressed but grossly normal. Stomach/Bowel: The stomach, duodenum and small are. No inflammatory changes,  mass lesions or obstructive findings. The terminal ileum is normal. The appendix is normal. The colon is moderately distended with fluid and stool all the way down to the rectosigmoid junction. The rectum is relatively decompressed. No findings for acute inflammatory process, mass lesion or obstruction. Patient may require sigmoidoscopy to exclude narrowing or stricture at the rectosigmoid junction. Vascular/Lymphatic: Age advanced atherosclerotic calcifications involving the aorta and iliac arteries but no aneurysm. The branch vessels are patent. The major venous structures are patent. No mesenteric or retroperitoneal mass or adenopathy. Reproductive: Surgically absent. Other: No pelvic mass or adenopathy. No free pelvic fluid collections. No inguinal mass or adenopathy. No abdominal wall hernia or subcutaneous lesions. Musculoskeletal: No significant bony findings. IMPRESSION: 1. Moderate distention of the colon with fluid and stool all the way down to the rectosigmoid junction. The rectum is relatively decompressed. No findings for acute inflammatory process, mass lesion or obstruction. Patient may require sigmoidoscopy to exclude narrowing  or stricture at the rectosigmoid junction. 2. Status post cholecystectomy with mild associated common bile duct dilatation. 3. Moderate to large hiatal hernia. 4. Age advanced atherosclerotic calcifications involving the aorta and iliac arteries. 5. Aortic atherosclerosis. Aortic Atherosclerosis (ICD10-I70.0).  Is Tiombe Tomeo Chiropractor Signed   By: Marijo Sanes M.D.   On: 05/18/2021 07:51   DG Abd 2 Views  Result Date: 05/18/2021 CLINICAL DATA:  Constipation.  Vomiting.  Dizziness. EXAM: ABDOMEN - 1 VIEW; ABDOMEN - 2 VIEW COMPARISON:  None. FINDINGS: Nonobstructive bowel gas pattern. Nonspecific air-fluid level within the right mid abdomen. Stool throughout the descending sigmoid colon. Round punctate density overlying the pelvis likely represent phleboliths. There is an oval approximately Chaley Castellanos 0.8 cm calcific density overlying the right renal shadow that may represent nephrolithiasis. Right upper quadrant surgical clips. Visualized lung bases grossly unremarkable. IMPRESSION: 1. Nonobstructive bowel gas pattern with stool throughout the descending and sigmoid colon. 2. Oval 0.8 cm calcific density overlying the right renal shadow may represent nephrolithiasis. Electronically Signed   By: Iven Finn M.D.   On: 05/18/2021 02:19    Microbiology: Recent Results (from the past 240 hour(s))  Resp Panel by RT-PCR (Flu Keeya Dyckman&B, Covid) Nasopharyngeal Swab     Status: None   Collection Time: 05/18/21  9:19 AM   Specimen: Nasopharyngeal Swab; Nasopharyngeal(NP) swabs in vial transport medium  Result Value Ref Range Status   SARS Coronavirus 2 by RT PCR NEGATIVE NEGATIVE Final    Comment: (NOTE) SARS-CoV-2 target nucleic acids are NOT DETECTED.  The SARS-CoV-2 RNA is generally detectable in upper respiratory specimens during the acute phase of infection. The lowest concentration of SARS-CoV-2 viral copies this assay can detect is 138 copies/mL. Makaiah Terwilliger negative result does not preclude SARS-Cov-2 infection and  should not be used as the sole basis for treatment or other patient management decisions. Arlanda Shiplett negative result may occur with  improper specimen collection/handling, submission of specimen other than nasopharyngeal swab, presence of viral mutation(s) within the areas targeted by this assay, and inadequate number of viral copies(<138 copies/mL). Schon Zeiders negative result must be combined with clinical observations, patient history, and epidemiological information. The expected result is Negative.  Fact Sheet for Patients:  EntrepreneurPulse.com.au  Fact Sheet for Healthcare Providers:  IncredibleEmployment.be  This test is no t yet approved or cleared by the [place] FDA and  has been authorized for detection and/or diagnosis of SARS-CoV-2 by FDA under an Emergency Use Authorization (EUA). This EUA will remain  in effect (meaning this test can be used) for the  duration of the COVID-19 declaration under Section 564(b)(1) of the Act, 21 U.S.C.section 360bbb-3(b)(1), unless the authorization is terminated  or revoked sooner.       Influenza Durante Violett by PCR NEGATIVE NEGATIVE Final   Influenza B by PCR NEGATIVE NEGATIVE Final    Comment: (NOTE) The Xpert Xpress SARS-CoV-2/FLU/RSV plus assay is intended as an aid in the diagnosis of influenza from Nasopharyngeal swab specimens and should not be used as Ethelyne Erich sole basis for treatment. Nasal washings and aspirates are unacceptable for Xpert Xpress SARS-CoV-2/FLU/RSV testing.  Fact Sheet for Patients: EntrepreneurPulse.com.au  Fact Sheet for Healthcare Providers: IncredibleEmployment.be  This test is not yet approved or cleared by the [place] FDA and has been authorized for detection and/or diagnosis of SARS-CoV-2 by FDA under an Emergency Use Authorization (EUA). This EUA will remain in effect (meaning this test can be used) for the duration of the COVID-19 declaration  under Section 564(b)(1) of the Act, 21 U.S.C. section 360bbb-3(b)(1), unless the authorization is terminated or revoked.  Performed at [facility], [address] [place], [place] [address]      Labs: Basic Metabolic Panel: Recent Labs  Lab 05/19/21 0444 05/20/21 0529 05/21/21 0634 05/22/21 0431 05/23/21 0431  NA 135 138 140 142 141  K 3.7 4.0 4.3 4.6 4.8  CL 97* 101 109 114* 114*  CO2 31 32 27 26 25   GLUCOSE 80 95 119* 82 77  BUN 23* 22* 16 10 8   CREATININE 1.07* 1.02* 0.97 0.84 0.87  CALCIUM 8.7* 8.6* 8.1* 8.2* 8.6*   Liver Function Tests: Recent Labs  Lab 05/17/21 1432 05/17/21 2153  AST 35 39  ALT 38* 39  ALKPHOS 153* 120  BILITOT 0.4 0.8  PROT 7.2 7.7  ALBUMIN 5.0* 4.6   Recent Labs  Lab 05/17/21 2153  LIPASE 35   No results for input(s): AMMONIA in the last 168 hours. CBC: Recent Labs  Lab 05/17/21 1432 05/17/21 2153 05/19/21 0444 05/20/21 0529 05/21/21 0634 05/22/21 0431 05/23/21 0431  WBC 7.7   < > 5.1 5.7 5.5 5.3 5.1  NEUTROABS 4.5  --   --  2.4 2.2 2.1 2.3  HGB 13.5   < > 10.5* 10.0* 9.2* 9.4* 9.5*  HCT 40.2   < > 31.3* 30.6* 28.9* 29.5* 29.6*  MCV 80   < > 81.1 80.7 82.8 82.6 82.0  PLT 272   < > 192 195 169 173 177   < > = values in this interval not displayed.   Cardiac Enzymes: No results for input(s): CKTOTAL, CKMB, CKMBINDEX, TROPONINI in the last 168 hours. BNP: BNP (last 3 results) No results for input(s): BNP in the last 8760 hours.  ProBNP (last 3 results) No results for input(s): PROBNP in the last 8760 hours.  CBG: No results for input(s): GLUCAP in the last 168 hours.     Signed:  Fayrene Helper MD.  [facility] 05/23/2021, 1:35 PM

## 2021-05-23 NOTE — Plan of Care (Signed)

## 2021-05-23 NOTE — Anesthesia Procedure Notes (Signed)
Date/Time: 05/23/2021 11:03 AM Performed by: Doreen Salvage, CRNA Pre-anesthesia Checklist: Patient identified, Emergency Drugs available, Suction available and Patient being monitored Patient Re-evaluated:Patient Re-evaluated prior to induction Oxygen Delivery Method: Nasal cannula Induction Type: IV induction Dental Injury: Teeth and Oropharynx as per pre-operative assessment  Comments: Nasal cannula with etCO2 monitoring

## 2021-05-23 NOTE — Anesthesia Preprocedure Evaluation (Addendum)
Anesthesia Evaluation  Patient identified by MRN, date of birth, ID band Patient awake    Reviewed: Allergy & Precautions, NPO status , Patient's Chart, lab work & pertinent test results  History of Anesthesia Complications Negative for: history of anesthetic complications  Airway Mallampati: III  TM Distance: <3 FB Neck ROM: limited    Dental  (+) Chipped, Upper Dentures, Poor Dentition, Missing   Pulmonary asthma , COPD (3L [place] at night),  oxygen dependent, Current Smoker and Patient abstained from smoking.,    Pulmonary exam normal        Cardiovascular hypertension, Normal cardiovascular exam     Neuro/Psych negative neurological ROS  negative psych ROS   GI/Hepatic negative GI ROS, Neg liver ROS,   Endo/Other  negative endocrine ROS  Renal/GU Renal disease  negative genitourinary   Musculoskeletal   Abdominal   Peds  Hematology negative hematology ROS (+)   Anesthesia Other Findings Patient is NPO appropriate and reports no nausea or vomiting today.  Past Medical History: No date: Asthma No date: Cancer ([place]) No date: COPD (chronic obstructive pulmonary disease) ([facility]) No date: Hypertension  Past Surgical History: No date: cesection No date: CHOLECYSTECTOMY  BMI    Body Mass Index: 30.42 kg/m      Reproductive/Obstetrics negative OB ROS                            Anesthesia Physical Anesthesia Plan  ASA: 3  Anesthesia Plan: General   Post-op Pain Management:    Induction: Intravenous  PONV Risk Score and Plan: Propofol infusion and TIVA  Airway Management Planned: Natural Airway and Nasal Cannula  Additional Equipment:   Intra-op Plan:   Post-operative Plan:   Informed Consent: I have reviewed the patients History and Physical, chart, labs and discussed the procedure including the risks, benefits and alternatives for the proposed anesthesia with the patient or  authorized representative who has indicated his/her understanding and acceptance.     Dental Advisory Given  Plan Discussed with: Anesthesiologist, CRNA and Surgeon  Anesthesia Plan Comments: (Patient consented for risks of anesthesia including but not limited to:  - adverse reactions to medications - risk of airway placement if required - damage to eyes, teeth, lips or other oral mucosa - nerve damage due to positioning  - sore throat or hoarseness - Damage to heart, brain, nerves, lungs, other parts of body or loss of life  Patient voiced understanding.)        Anesthesia Quick Evaluation

## 2021-05-23 NOTE — Op Note (Signed)
[facility] Patient Name: Anna Cruz Procedure Date: 05/23/2021 10:45 AM MRN: [id number] Account #: [id number] Date of Birth: [birth year] Admit Type: Outpatient Age: 60 Room: [facility] ENDO ROOM 1 Gender: Female Note Status: Finalized Procedure:             Upper GI endoscopy Indications:           Abdominal bloating, Early satiety Providers:             Andrey Farmer MD, MD Medicines:             Monitored Anesthesia Care Complications:         No immediate complications. Procedure:             Pre-Anesthesia Assessment:                        - Prior to the procedure, a History and Physical was                         performed, and patient medications and allergies were                         reviewed. The patient is competent. The risks and                         benefits of the procedure and the sedation options and                         risks were discussed with the patient. All questions                         were answered and informed consent was obtained.                         Patient identification and proposed procedure were                         verified by the physician, the nurse, the anesthetist                         and the technician in the endoscopy suite. Mental                         Status Examination: alert and oriented. Airway                         Examination: normal oropharyngeal airway and neck                         mobility. Respiratory Examination: clear to                         auscultation. CV Examination: normal. Prophylactic                         Antibiotics: The patient does not require prophylactic                         antibiotics. Prior Anticoagulants: The patient has  taken no previous anticoagulant or antiplatelet                         agents. ASA Grade Assessment: II - A patient with mild                         systemic disease. After reviewing the risks and                          benefits, the patient was deemed in satisfactory                         condition to undergo the procedure. The anesthesia                         plan was to use monitored anesthesia care (MAC).                         Immediately prior to administration of medications,                         the patient was re-assessed for adequacy to receive                         sedatives. The heart rate, respiratory rate, oxygen                         saturations, blood pressure, adequacy of pulmonary                         ventilation, and response to care were monitored                         throughout the procedure. The physical status of the                         patient was re-assessed after the procedure.                        After obtaining informed consent, the endoscope was                         passed under direct vision. Throughout the procedure,                         the patient's blood pressure, pulse, and oxygen                         saturations were monitored continuously. The Endoscope                         was introduced through the mouth, and advanced to the                         second part of duodenum. The upper GI endoscopy was                         accomplished without difficulty. The patient tolerated  the procedure well. Findings:      A 6 cm hiatal hernia was present.      The examined esophagus was normal.      The entire examined stomach was normal.      The examined duodenum was normal. Impression:            - 6 cm hiatal hernia.                        - Normal esophagus.                        - Normal stomach.                        - Normal examined duodenum.                        - No specimens collected. Recommendation:        - Return patient to hospital ward for possible                         discharge same day.                        - Resume regular diet.                        -  Continue present medications.                        - Return to GI clinic as previously scheduled. Procedure Code(s):     --- Professional ---                        [phone], Esophagogastroduodenoscopy, flexible,                         transoral; diagnostic, including collection of                         specimen(s) by brushing or washing, when performed                         (separate procedure) Diagnosis Code(s):     --- Professional ---                        K44.9, Diaphragmatic hernia without obstruction or                         gangrene                        R14.0, Abdominal distension (gaseous)                        R68.81, Early satiety CPT copyright 2019 American Medical Association. All rights reserved. The codes documented in this report are preliminary and upon coder review may  be revised to meet current compliance requirements. Andrey Farmer MD, MD 05/23/2021 11:03:19 AM Number of Addenda: 0 Note Initiated On: 05/23/2021 10:45 AM Estimated Blood Loss:  Estimated blood loss: none.      [facility]

## 2021-05-24 ENCOUNTER — Encounter: Payer: Self-pay | Admitting: Gastroenterology

## 2021-05-24 NOTE — Telephone Encounter (Signed)
Once we get cardiac clearance, I will need to schedule her a colonoscopy with Dr. Vicente Males. Diagnosis: Constipation

## 2021-05-25 NOTE — Telephone Encounter (Signed)
Patient went to the ED and had an EGD and Colonoscopy done by Dr. Jeannine Kitten. Dr. Vicente Males was informed that this had been done.

## 2021-05-29 ENCOUNTER — Ambulatory Visit: Payer: Medicaid Other | Admitting: Gastroenterology

## 2021-06-05 ENCOUNTER — Ambulatory Visit
Admission: RE | Admit: 2021-06-05 | Discharge: 2021-06-05 | Disposition: A | Discharge status: Home / Self Care | Payer: Medicaid Other | Attending: Family Medicine | Admitting: Family Medicine

## 2021-06-05 ENCOUNTER — Ambulatory Visit
Admission: RE | Admit: 2021-06-05 | Discharge: 2021-06-05 | Disposition: A | Discharge status: Home / Self Care | Payer: Medicaid Other | Source: Ambulatory Visit | Attending: Family Medicine | Admitting: Family Medicine

## 2021-06-05 ENCOUNTER — Other Ambulatory Visit: Payer: Self-pay | Admitting: Family Medicine

## 2021-06-05 DIAGNOSIS — J90 Pleural effusion, not elsewhere classified: Secondary | ICD-10-CM | POA: Insufficient documentation

## 2021-07-17 ENCOUNTER — Ambulatory Visit: Payer: Medicaid Other | Admitting: Gastroenterology

## 2021-08-02 ENCOUNTER — Other Ambulatory Visit: Payer: Self-pay

## 2021-08-02 ENCOUNTER — Ambulatory Visit: Payer: Medicaid Other | Admitting: Gastroenterology

## 2021-08-02 ENCOUNTER — Encounter: Payer: Self-pay | Admitting: Gastroenterology

## 2021-08-02 VITALS — BP 106/72 | HR 83 | Temp 98.0°F | Wt 155.0 lb

## 2021-08-02 DIAGNOSIS — Z86010 Personal history of colonic polyps: Secondary | ICD-10-CM

## 2021-08-02 DIAGNOSIS — R7989 Other specified abnormal findings of blood chemistry: Secondary | ICD-10-CM

## 2021-08-02 DIAGNOSIS — K5909 Other constipation: Secondary | ICD-10-CM

## 2021-08-02 DIAGNOSIS — R945 Abnormal results of liver function studies: Secondary | ICD-10-CM | POA: Diagnosis not present

## 2021-08-02 MED ORDER — NA SULFATE-K SULFATE-MG SULF 17.5-3.13-1.6 GM/177ML PO SOLN: 708.0000 mL | Freq: Once | ORAL | 0 refills | Status: AC

## 2021-08-02 NOTE — Patient Instructions (Signed)
Please give [place] a call in two weeks to let us know if Trulance has helped so we could send you a prescription to your pharmacy.  Mediterranean Diet A Mediterranean diet refers to food and lifestyle choices that are based on the traditions of countries located on the [employer]. This way of eating has been shown to help prevent certain conditions and improve outcomes for people who have chronic diseases, like kidney disease and heart disease. What are tips for following this plan? Lifestyle Cook and eat meals together with your family, when possible. Drink enough fluid to keep your urine clear or pale yellow. Be physically active every day. This includes: Aerobic exercise like running or swimming. Leisure activities like gardening, walking, or housework. Get 7-8 hours of sleep each night. If recommended by your health care provider, drink red wine in moderation. This means 1 glass a day for nonpregnant women and 2 glasses a day for men. A glass of wine equals 5 oz (150 mL). Reading food labels  Check the serving size of packaged foods. For foods such as rice and pasta, the serving size refers to the amount of cooked product, not dry. Check the total fat in packaged foods. Avoid foods that have saturated fat or trans fats. Check the ingredients list for added sugars, such as corn syrup. Shopping At the grocery store, buy most of your food from the areas near the walls of the store. This includes: Fresh fruits and vegetables (produce). Grains, beans, nuts, and seeds. Some of these may be available in unpackaged forms or large amounts (in bulk). Fresh seafood. Poultry and eggs. Low-fat dairy products. Buy whole ingredients instead of prepackaged foods. Buy fresh fruits and vegetables in-season from local farmers markets. Buy frozen fruits and vegetables in resealable bags. If you do not have access to quality fresh seafood, buy precooked frozen shrimp or canned fish, such as tuna,  salmon, or sardines. Buy small amounts of raw or cooked vegetables, salads, or olives from the deli or salad bar at your store. Stock your pantry so you always have certain foods on hand, such as olive oil, canned tuna, canned tomatoes, rice, pasta, and beans. Cooking Cook foods with extra-virgin olive oil instead of using butter or other vegetable oils. Have meat as a side dish, and have vegetables or grains as your main dish. This means having meat in small portions or adding small amounts of meat to foods like pasta or stew. Use beans or vegetables instead of meat in common dishes like chili or lasagna. Experiment with different cooking methods. Try roasting or broiling vegetables instead of steaming or sauteing them. Add frozen vegetables to soups, stews, pasta, or rice. Add nuts or seeds for added healthy fat at each meal. You can add these to yogurt, salads, or vegetable dishes. Marinate fish or vegetables using olive oil, lemon juice, garlic, and fresh herbs. Meal planning  Plan to eat 1 vegetarian meal one day each week. Try to work up to 2 vegetarian meals, if possible. Eat seafood 2 or more times a week. Have healthy snacks readily available, such as: Vegetable sticks with hummus. Greek yogurt. Fruit and nut trail mix. Eat balanced meals throughout the week. This includes: Fruit: 2-3 servings a day Vegetables: 4-5 servings a day Low-fat dairy: 2 servings a day Fish, poultry, or lean meat: 1 serving a day Beans and legumes: 2 or more servings a week Nuts and seeds: 1-2 servings a day Whole grains: 6-8 servings a day Extra-virgin  olive oil: 3-4 servings a day Limit red meat and sweets to only a few servings a month What are my food choices? Mediterranean diet Recommended Grains: Whole-grain pasta. Brown rice. Bulgar wheat. Polenta. Couscous. Whole-wheat bread. [place]. Vegetables: Artichokes. Beets. Broccoli. Cabbage. Carrots. Eggplant. Green beans. Chard. Kale.  Spinach. Onions. Leeks. Peas. Squash. Tomatoes. Peppers. Radishes. Fruits: Apples. Apricots. Avocado. Berries. Bananas. Cherries. Dates. Figs. Grapes. Lemons. Melon. Oranges. Peaches. Plums. Pomegranate. Meats and other protein foods: Beans. Almonds. Sunflower seeds. Pine nuts. Peanuts. [place]. Salmon. Scallops. Shrimp. [place]. Tilapia. Clams. Oysters. Eggs. Dairy: Low-fat milk. Cheese. Greek yogurt. Beverages: Water. Red wine. Herbal tea. Fats and oils: Extra virgin olive oil. Avocado oil. Grape seed oil. Sweets and desserts: [place] yogurt with honey. Baked apples. Poached pears. Trail mix. Seasoning and other foods: Basil. Cilantro. Coriander. Cumin. Mint. Parsley. Sage. Rosemary. Tarragon. Garlic. Oregano. Thyme. Pepper. Balsalmic vinegar. Tahini. Hummus. Tomato sauce. Olives. Mushrooms. Limit these Grains: Prepackaged pasta or rice dishes. Prepackaged cereal with added sugar. Vegetables: Deep fried potatoes (french fries). Fruits: Fruit canned in syrup. Meats and other protein foods: Beef. Pork. Lamb. Poultry with skin. Hot dogs. Berniece Salines. Dairy: Ice cream. Sour cream. Whole milk. Beverages: Juice. Sugar-sweetened soft drinks. Beer. Liquor and spirits. Fats and oils: Butter. Canola oil. Vegetable oil. Beef fat (tallow). Lard. Sweets and desserts: Cookies. Cakes. Pies. Candy. Seasoning and other foods: Mayonnaise. Premade sauces and marinades. The items listed may not be a complete list. Talk with your dietitian about what dietary choices are right for you. Summary The Mediterranean diet includes both food and lifestyle choices. Eat a variety of fresh fruits and vegetables, beans, nuts, seeds, and whole grains. Limit the amount of red meat and sweets that you eat. Talk with your health care provider about whether it is safe for you to drink red wine in moderation. This means 1 glass a day for nonpregnant women and 2 glasses a day for men. A glass of wine equals 5 oz (150 mL). This information is  not intended to replace advice given to you by your health care provider. Make sure you discuss any questions you have with your health care provider. Document Revised: 07/11/2016 Document Reviewed: 07/04/2016 Elsevier Patient Education  [place].

## 2021-08-02 NOTE — Progress Notes (Signed)
Anna Bellows MD, MRCP(U.K) [address]  [place]  [place], [place] [address]  Main: [phone]  Fax: [phone]   Primary Care Physician: [facility]  Primary Gastroenterologist:  Dr. Jonathon Cruz    C/c : Follow up for abnormal LFT's constipation   HPI: Anna Cruz is a 60 y.o. female   Summary of history :  Initially referred and seen on 05/17/2021 for GERD but reported none during her visit.  Noted to have elevated alkaline phosphatase 169.  Also mention she had issues with constipation. She is having a bowel movement only once a week.  Failed multiple OTC meds.  No unintentional weight loss.   Interval history   05/17/2021-08/02/2021  She present to the hospital and was admitted with severe constipation.  Given enemas underwent a colonoscopy.  05/18/2021 abdominal x-ray showed with stool throughout the descending and sigmoid colon.  Possible nephrolithiasis. 05/18/2021 CT scan of the abdomen pelvis with contrast showed moderate distention of the colon with fluid in stool all the way down to the rectosigmoid moderate to large hiatal hernia.  05/22/2021: Colonoscopy no gross abnormalities noted.  Prep was poor. 05/23/2021: EGD: 6 cm hiatal hernia noted otherwise normal.  05/17/2021: PTH, GGTnormal .  Alkaline phosphatase 153. Smooth muscle antibody and ANA was negative   05/23/2021: Hemoglobin 9.5 g  She is doing well.  Not had a bowel meant for 3 days.  She denies receiving any samples the last time she came to our office for the visit nor did she say that she was discharged on any laxatives.  Feels like she is getting constipated presently. Current Outpatient Medications  Medication Sig Dispense Refill   albuterol (PROVENTIL) (2.5 MG/3ML) 0.083% nebulizer solution Take 2.5 mg by nebulization every 4 (four) hours as needed for wheezing or shortness of breath.     albuterol (VENTOLIN HFA) 108 (90 Base) MCG/ACT inhaler Inhale 2 puffs into the  lungs every 4 (four) hours as needed for wheezing or shortness of breath.     Ascorbic Acid (VITAMIN C) 1000 MG tablet Take 1,000 mg by mouth daily.     atorvastatin (LIPITOR) 20 MG tablet Take 20 mg by mouth daily.     citalopram (CELEXA) 20 MG tablet Take 20 mg by mouth daily.     cyanocobalamin (,VITAMIN B-12,) 1000 MCG/ML injection Inject 1,000 mcg into the muscle every 30 (thirty) days.     docusate sodium (COLACE) 100 MG capsule Take 200 mg by mouth daily.     ferrous sulfate 325 (65 FE) MG tablet Take 1 tablet (325 mg total) by mouth daily with breakfast. 30 tablet 1   FLUoxetine (PROZAC) 40 MG capsule Take 40 mg by mouth 2 (two) times daily.     fluticasone (FLONASE) 50 MCG/ACT nasal spray Place 1 spray into both nostrils daily.     fluticasone-salmeterol (ADVAIR) 250-50 MCG/ACT AEPB Inhale 1 puff into the lungs in the morning and at bedtime.     folic acid (FOLVITE) 1 MG tablet Take 1 mg by mouth daily.     furosemide (LASIX) 20 MG tablet Take 20 mg by mouth daily.     hydrOXYzine (ATARAX/VISTARIL) 50 MG tablet Take 100 mg by mouth every 8 (eight) hours as needed for itching or anxiety.     linaclotide (LINZESS) 145 MCG CAPS capsule Take 145 mcg by mouth daily.     loratadine (CLARITIN) 10 MG tablet Take 10 mg by mouth daily.     montelukast (SINGULAIR)  10 MG tablet Take 10 mg by mouth daily.     Multiple Vitamin (MULTIVITAMIN) tablet Take 1 tablet by mouth daily.     nicotine (NICODERM CQ - DOSED IN MG/24 HOURS) 21 mg/24hr patch Place 21 mg onto the skin daily.     ondansetron (ZOFRAN) 8 MG tablet Take 8 mg by mouth every 8 (eight) hours as needed for nausea.     pantoprazole (PROTONIX) 40 MG tablet Take 40 mg by mouth daily.     polyethylene glycol-electrolytes (NULYTELY) 420 g solution Drink one 8 oz glass of mixture every 15 minutes until you finish all of the jug or until you feel empty. 4000 mL 0   prazosin (MINIPRESS) 1 MG capsule Take 2 mg by mouth at bedtime.     QUEtiapine  (SEROQUEL) 300 MG tablet Take 600 mg by mouth at bedtime.     Sodium Phosphates (ENEMA) 7-19 GM/118ML ENEM SMARTSIG:Rectally Once     tiotropium (SPIRIVA) 18 MCG inhalation capsule Place 18 mcg into inhaler and inhale daily.     traZODone (DESYREL) 100 MG tablet Take 200 mg by mouth at bedtime.     zolpidem (AMBIEN) 10 MG tablet Take 10 mg by mouth at bedtime.     No current facility-administered medications for this visit.    Allergies as of 08/02/2021   (No Known Allergies)    ROS:  General: Negative for anorexia, weight loss, fever, chills, fatigue, weakness. ENT: Negative for hoarseness, difficulty swallowing , nasal congestion. CV: Negative for chest pain, angina, palpitations, dyspnea on exertion, peripheral edema.  Respiratory: Negative for dyspnea at rest, dyspnea on exertion, cough, sputum, wheezing.  GI: See history of present illness. GU:  Negative for dysuria, hematuria, urinary incontinence, urinary frequency, nocturnal urination.  Endo: Negative for unusual weight change.    Physical Examination:   BP 106/72 (BP Location: Left Arm, Patient Position: Sitting, Cuff Size: Normal)   Pulse 83   Temp 98 F (36.7 C) (Oral)   Wt 155 lb (70.3 kg)   BMI 29.29 kg/m   General: Well-nourished, well-developed in no acute distress.  Eyes: No icterus. Conjunctivae pink. Neuro: Alert and oriented x 3.  Grossly intact. Skin: Warm and dry, no jaundice.   Psych: Alert and cooperative, normal mood and affect.   Imaging Studies: No results found.  Assessment and Plan:   [name] is a 59 y.o. y/o female here to follow up  for severe constipation.  Incidentally noted to have elevated alkaline phosphatase which is not related to the liver as GGT normal.     Plan 1.  We will need a repeat colonoscopy with 2-day prep to check for polyps as prep was inadequate for colonoscopy during the procedure.  Personal history of previous colon polyps 2.  Commence on Trulance: 2-week  sample provided 3.  Elevated alkaline phosphatase elevated but not related to liver as GGT is normal.  I have discussed alternative options, risks & benefits,  which include, but are not limited to, bleeding, infection, perforation,respiratory complication & drug reaction.  The patient agrees with this plan & written consent will be obtained.     Dr Anna Bellows  MD,MRCP [facility]) Follow up in 6 to 8 weeks

## 2021-08-06 ENCOUNTER — Encounter: Payer: Self-pay | Admitting: Gastroenterology

## 2021-08-07 ENCOUNTER — Ambulatory Visit: Payer: Medicaid Other | Admitting: Anesthesiology

## 2021-08-07 ENCOUNTER — Telehealth: Payer: Self-pay

## 2021-08-07 ENCOUNTER — Encounter
Admission: RE | Disposition: A | Discharge status: Home / Self Care | Payer: Self-pay | Source: Home / Self Care | Attending: Gastroenterology

## 2021-08-07 ENCOUNTER — Ambulatory Visit
Admission: RE | Admit: 2021-08-07 | Discharge: 2021-08-07 | Disposition: A | Discharge status: Home / Self Care | Payer: Medicaid Other | Attending: Gastroenterology | Admitting: Gastroenterology

## 2021-08-07 ENCOUNTER — Encounter: Payer: Self-pay | Admitting: Gastroenterology

## 2021-08-07 DIAGNOSIS — D128 Benign neoplasm of rectum: Secondary | ICD-10-CM | POA: Insufficient documentation

## 2021-08-07 DIAGNOSIS — Z7951 Long term (current) use of inhaled steroids: Secondary | ICD-10-CM | POA: Diagnosis not present

## 2021-08-07 DIAGNOSIS — K635 Polyp of colon: Secondary | ICD-10-CM | POA: Diagnosis not present

## 2021-08-07 DIAGNOSIS — Z1211 Encounter for screening for malignant neoplasm of colon: Secondary | ICD-10-CM | POA: Diagnosis not present

## 2021-08-07 DIAGNOSIS — Z9981 Dependence on supplemental oxygen: Secondary | ICD-10-CM | POA: Insufficient documentation

## 2021-08-07 DIAGNOSIS — Z79899 Other long term (current) drug therapy: Secondary | ICD-10-CM | POA: Diagnosis not present

## 2021-08-07 DIAGNOSIS — K5909 Other constipation: Secondary | ICD-10-CM

## 2021-08-07 DIAGNOSIS — D125 Benign neoplasm of sigmoid colon: Secondary | ICD-10-CM | POA: Insufficient documentation

## 2021-08-07 DIAGNOSIS — F17210 Nicotine dependence, cigarettes, uncomplicated: Secondary | ICD-10-CM | POA: Insufficient documentation

## 2021-08-07 DIAGNOSIS — Z86010 Personal history of colonic polyps: Secondary | ICD-10-CM

## 2021-08-07 HISTORY — PX: COLONOSCOPY WITH PROPOFOL: SHX5780

## 2021-08-07 SURGERY — COLONOSCOPY WITH PROPOFOL
Anesthesia: General

## 2021-08-07 MED ORDER — SODIUM CHLORIDE 0.9 % IV SOLN: INTRAVENOUS | Status: DC

## 2021-08-07 MED ORDER — PROPOFOL 10 MG/ML IV BOLUS
INTRAVENOUS | Status: DC | PRN
  Administered 2021-08-07: 100 mg via INTRAVENOUS

## 2021-08-07 MED ORDER — PROPOFOL 500 MG/50ML IV EMUL
INTRAVENOUS | Status: AC
  Filled 2021-08-07: qty 50

## 2021-08-07 MED ORDER — DEXMEDETOMIDINE HCL IN NACL 200 MCG/50ML IV SOLN
INTRAVENOUS | Status: AC
  Filled 2021-08-07: qty 50

## 2021-08-07 MED ORDER — LIDOCAINE HCL (PF) 2 % IJ SOLN
INTRAMUSCULAR | Status: AC
  Filled 2021-08-07: qty 5

## 2021-08-07 MED ORDER — PROPOFOL 10 MG/ML IV BOLUS
INTRAVENOUS | Status: AC
  Filled 2021-08-07: qty 20

## 2021-08-07 MED ORDER — PEG 3350-KCL-NA BICARB-NACL 420 G PO SOLR: ORAL | 0 refills | Status: AC

## 2021-08-07 MED ORDER — SODIUM CHLORIDE (PF) 0.9 % IJ SOLN
INTRAMUSCULAR | Status: DC | PRN
  Administered 2021-08-07: 3 mL

## 2021-08-07 MED ORDER — PHENYLEPHRINE HCL (PRESSORS) 10 MG/ML IV SOLN
INTRAVENOUS | Status: DC | PRN
  Administered 2021-08-07: 100 ug via INTRAVENOUS

## 2021-08-07 MED ORDER — PHENYLEPHRINE HCL (PRESSORS) 10 MG/ML IV SOLN
INTRAVENOUS | Status: AC
  Filled 2021-08-07: qty 1

## 2021-08-07 MED ORDER — PROPOFOL 500 MG/50ML IV EMUL
INTRAVENOUS | Status: DC | PRN
  Administered 2021-08-07: 130 ug/kg/min via INTRAVENOUS

## 2021-08-07 NOTE — Telephone Encounter (Signed)
Per Dr. Georgeann Oppenheim secure chat: Good morning : Her colon was not clean today : second attempt. Can we do a two day prep with 2 gallons of golytely : schedule for either Thursday or Monday which ever she wishes. Call her in the afternoon once she reaches home. Called patient and had to leave her a detailed message letting her know to please give [place] a call so we could reschedule her colonoscopy since her colon was not cleaning out.

## 2021-08-07 NOTE — H&P (Signed)
Jonathon Bellows, MD [address] [place], [place], [place], [address] [address], [place], [place], [place], [address] Phone: [phone]  Fax: [phone]  Primary Care Physician:  [facility]   Pre-Procedure History & Physical: HPI:  Anna Cruz is a 60 y.o. female is here for an colonoscopy.   Past Medical History:  Diagnosis Date   Asthma    Cancer ([place])    COPD (chronic obstructive pulmonary disease) ([place])    Hypertension     Past Surgical History:  Procedure Laterality Date   cesection     CHOLECYSTECTOMY     ESOPHAGOGASTRODUODENOSCOPY (EGD) WITH PROPOFOL N/A 05/23/2021   Procedure: ESOPHAGOGASTRODUODENOSCOPY (EGD) WITH PROPOFOL;  Surgeon: Lesly Rubenstein, MD;  Location: [facility] ENDOSCOPY;  Service: Endoscopy;  Laterality: N/A;   FLEXIBLE SIGMOIDOSCOPY N/A 05/22/2021   Procedure: FLEXIBLE SIGMOIDOSCOPY;  Surgeon: Lesly Rubenstein, MD;  Location: [facility] ENDOSCOPY;  Service: Endoscopy;  Laterality: N/A;    Prior to Admission medications   Medication Sig Start Date End Date Taking? Authorizing Provider  albuterol (PROVENTIL) (2.5 MG/3ML) 0.083% nebulizer solution Take 2.5 mg by nebulization every 4 (four) hours as needed for wheezing or shortness of breath.   Yes [provider]  albuterol (VENTOLIN HFA) 108 (90 Base) MCG/ACT inhaler Inhale 2 puffs into the lungs every 4 (four) hours as needed for wheezing or shortness of breath.   Yes [provider]  atorvastatin (LIPITOR) 20 MG tablet Take 20 mg by mouth daily.   Yes [provider]  docusate sodium (COLACE) 100 MG capsule Take 200 mg by mouth daily.   Yes [provider]  furosemide (LASIX) 20 MG tablet Take 20 mg by mouth daily.   Yes [provider]  pantoprazole (PROTONIX) 40 MG tablet Take 40 mg by mouth daily.   Yes [provider]  prazosin (MINIPRESS) 1 MG capsule Take 2 mg by mouth at bedtime.   Yes [provider]   QUEtiapine (SEROQUEL) 300 MG tablet Take 600 mg by mouth at bedtime.   Yes [provider]  tiotropium (SPIRIVA) 18 MCG inhalation capsule Place 18 mcg into inhaler and inhale daily.   Yes [provider]  traZODone (DESYREL) 100 MG tablet Take 200 mg by mouth at bedtime.   Yes [provider]  zolpidem (AMBIEN) 10 MG tablet Take 10 mg by mouth at bedtime.   Yes [provider]  Ascorbic Acid (VITAMIN C) 1000 MG tablet Take 1,000 mg by mouth daily.    [provider]  citalopram (CELEXA) 20 MG tablet Take 20 mg by mouth daily.    [provider]  cyanocobalamin (,VITAMIN B-12,) 1000 MCG/ML injection Inject 1,000 mcg into the muscle every 30 (thirty) days.    [provider]  ferrous sulfate 325 (65 FE) MG tablet Take 1 tablet (325 mg total) by mouth daily with breakfast. Patient not taking: Reported on 08/07/2021 06/06/21   Elodia [place]., MD  FLUoxetine (PROZAC) 40 MG capsule Take 40 mg by mouth 2 (two) times daily. 04/17/21   [provider]  fluticasone (FLONASE) 50 MCG/ACT nasal spray Place 1 spray into both nostrils daily. 02/14/21   [provider]  fluticasone-salmeterol (ADVAIR) 250-50 MCG/ACT AEPB Inhale 1 puff into the lungs in the morning and at bedtime. 04/24/21   [provider]  folic acid (FOLVITE) 1 MG tablet Take 1 mg by mouth daily.    [provider]  hydrOXYzine (ATARAX/VISTARIL) 50  MG tablet Take 100 mg by mouth every 8 (eight) hours as needed for itching or anxiety.    [provider]  linaclotide (LINZESS) 145 MCG CAPS capsule Take 145 mcg by mouth daily.    [provider]  loratadine (CLARITIN) 10 MG tablet Take 10 mg by mouth daily.    [provider]  montelukast (SINGULAIR) 10 MG tablet Take 10 mg by mouth daily. 12/12/20   [provider]  Multiple Vitamin (MULTIVITAMIN) tablet Take 1 tablet by mouth daily.    [provider]   nicotine (NICODERM CQ - DOSED IN MG/24 HOURS) 21 mg/24hr patch Place 21 mg onto the skin daily.    [provider]  ondansetron (ZOFRAN) 8 MG tablet Take 8 mg by mouth every 8 (eight) hours as needed for nausea.    [provider]  polyethylene glycol-electrolytes (NULYTELY) 420 g solution Drink one 8 oz glass of mixture every 15 minutes until you finish all of the jug or until you feel empty. 05/17/21   Jonathon Bellows, MD  Sodium Phosphates (ENEMA) 7-19 GM/118ML ENEM SMARTSIG:Rectally Once 05/17/21   [provider]    Allergies as of 08/02/2021   (No Known Allergies)    History reviewed. No pertinent family history.  Social History   Socioeconomic History   Marital status: Married    Spouse name: Not on file   Number of children: Not on file   Years of education: Not on file   Highest education level: Not on file  Occupational History   Not on file  Tobacco Use   Smoking status: Some Days    Packs/day: 0.50    Types: Cigarettes   Smokeless tobacco: Never  Vaping Use   Vaping Use: Never used  Substance and Sexual Activity   Alcohol use: Not Currently    Comment: recovering alcoholic   Drug use: Not Currently   Sexual activity: Not on file  Other Topics Concern   Not on file  Social History Narrative   Not on file   Social Determinants of Health   Financial Resource Strain: Not on file  Food Insecurity: Not on file  Transportation Needs: Not on file  Physical Activity: Not on file  Stress: Not on file  Social Connections: Not on file  Intimate Partner Violence: Not on file    Review of Systems: See HPI, otherwise negative ROS  Physical Exam: There were no vitals taken for this visit. General:   Alert,  pleasant and cooperative in NAD Head:  Normocephalic and atraumatic. Neck:  Supple; no masses or thyromegaly. Lungs:  Clear throughout to auscultation, normal respiratory effort.    Heart:  +S1, +S2, Regular rate and rhythm, No  edema. Abdomen:  Soft, nontender and nondistended. Normal bowel sounds, without guarding, and without rebound.   Neurologic:  Alert and  oriented x4;  grossly normal neurologically.  Impression/Plan: Anna Cruz is here for an colonoscopy to be performed for Screening colonoscopy average risk   Risks, benefits, limitations, and alternatives regarding  colonoscopy have been reviewed with the patient.  Questions have been answered.  All parties agreeable.   Jonathon Bellows, MD  08/07/2021, 9:21 AM

## 2021-08-07 NOTE — Transfer of Care (Signed)
Immediate Anesthesia Transfer of Care Note  Patient: Anna Cruz  Procedure(s) Performed: COLONOSCOPY WITH PROPOFOL  Patient Location: PACU and Endoscopy Unit  Anesthesia Type:General  Level of Consciousness: awake, alert  and oriented  Airway & Oxygen Therapy: Patient Spontanous Breathing  Post-op Assessment: Report given to RN and Post -op Vital signs reviewed and stable  Post vital signs: Reviewed and stable  Last Vitals:  Vitals Value Taken Time  BP 83/62 08/07/21 0959  Temp 35.6 C 08/07/21 0957  Pulse 74 08/07/21 1003  Resp 17 08/07/21 1003  SpO2 96 % 08/07/21 1003  Vitals shown include unvalidated device data.  Last Pain:  Vitals:   08/07/21 0957  TempSrc: Temporal         Complications: No notable events documented.

## 2021-08-07 NOTE — Anesthesia Preprocedure Evaluation (Addendum)
Anesthesia Evaluation  Patient identified by MRN, date of birth, ID band Patient awake    Reviewed: Allergy & Precautions, NPO status , Patient's Chart, lab work & pertinent test results  History of Anesthesia Complications Negative for: history of anesthetic complications  Airway Mallampati: III  TM Distance: <3 FB Neck ROM: limited    Dental  (+) Chipped, Upper Dentures, Poor Dentition, Missing   Pulmonary asthma , COPD (3L [place] at night),  oxygen dependent, Current Smoker and Patient abstained from smoking.,    Pulmonary exam normal        Cardiovascular hypertension, Pt. on medications Normal cardiovascular exam     Neuro/Psych Depression negative neurological ROS     GI/Hepatic Neg liver ROS, hiatal hernia (Small), GERD  Medicated,  Endo/Other  negative endocrine ROS  Renal/GU Renal InsufficiencyRenal disease  negative genitourinary   Musculoskeletal   Abdominal   Peds  Hematology  (+) anemia ,   Anesthesia Other Findings Past Medical History: No date: Asthma No date: Cancer ([place]) No date: COPD (chronic obstructive pulmonary disease) (HCC) No date: Hypertension  Past Surgical History: No date: cesection No date: CHOLECYSTECTOMY  BMI    Body Mass Index: 30.42 kg/m      Reproductive/Obstetrics negative OB ROS                            Anesthesia Physical  Anesthesia Plan  ASA: 3  Anesthesia Plan: General   Post-op Pain Management:    Induction: Intravenous  PONV Risk Score and Plan: Propofol infusion and TIVA  Airway Management Planned: Natural Airway and Nasal Cannula  Additional Equipment:   Intra-op Plan:   Post-operative Plan:   Informed Consent: I have reviewed the patients History and Physical, chart, labs and discussed the procedure including the risks, benefits and alternatives for the proposed anesthesia with the patient or authorized representative  who has indicated his/her understanding and acceptance.     Dental Advisory Given  Plan Discussed with: Anesthesiologist, CRNA and Surgeon  Anesthesia Plan Comments: (Patient consented for risks of anesthesia including but not limited to:  - adverse reactions to medications - risk of airway placement if required - damage to eyes, teeth, lips or other oral mucosa - nerve damage due to positioning  - sore throat or hoarseness - Damage to heart, brain, nerves, lungs, other parts of body or loss of life  Patient voiced understanding.)        Anesthesia Quick Evaluation

## 2021-08-07 NOTE — Anesthesia Postprocedure Evaluation (Signed)
Anesthesia Post Note  Patient: Anna Cruz  Procedure(s) Performed: COLONOSCOPY WITH PROPOFOL  Patient location during evaluation: Endoscopy Anesthesia Type: General Level of consciousness: awake and alert Pain management: pain level controlled Vital Signs Assessment: post-procedure vital signs reviewed and stable Respiratory status: spontaneous breathing, nonlabored ventilation, respiratory function stable and patient connected to nasal cannula oxygen Cardiovascular status: blood pressure returned to baseline and stable Postop Assessment: no apparent nausea or vomiting Anesthetic complications: no   No notable events documented.   Last Vitals:  Vitals:   08/07/21 0957 08/07/21 1017  BP: (!) 83/62 100/61  Pulse:  74  Resp: 16 14  Temp: (!) 35.6 C   SpO2: 96% 95%    Last Pain:  Vitals:   08/07/21 0957  TempSrc: Temporal                 Margaree Mackintosh

## 2021-08-07 NOTE — Op Note (Signed)
[facility] Patient Name: Anna Cruz Procedure Date: 08/07/2021 9:03 AM MRN: [id number] Account #: [id number] Date of Birth: 1961-06-10 Admit Type: Outpatient Age: 60 Room: [facility] ENDO ROOM 2 Gender: Female Note Status: Finalized Instrument Name: Jasper Riling [id number] Procedure:             Colonoscopy Indications:           Screening for colorectal malignant neoplasm Providers:             Jonathon Bellows MD, MD Referring MD:          [place], MD (Referring MD) Medicines:             Monitored Anesthesia Care Complications:         No immediate complications. Procedure:             Pre-Anesthesia Assessment:                        - Prior to the procedure, a History and Physical was                         performed, and patient medications, allergies and                         sensitivities were reviewed. The patient's tolerance                         of previous anesthesia was reviewed.                        - The risks and benefits of the procedure and the                         sedation options and risks were discussed with the                         patient. All questions were answered and informed                         consent was obtained.                        - ASA Grade Assessment: II - A patient with mild                         systemic disease.                        After obtaining informed consent, the colonoscope was                         passed under direct vision. Throughout the procedure,                         the patient's blood pressure, pulse, and oxygen                         saturations were monitored continuously. The  Colonoscope was introduced through the anus and                         advanced to the the cecum, identified by the                         appendiceal orifice. The colonoscopy was performed                         without difficulty. The  patient tolerated the                         procedure well. The quality of the bowel preparation                         was poor. Findings:      The perianal and digital rectal examinations were normal.      A 7 mm polyp was found in the rectum. The polyp was sessile. The polyp       was removed with a cold snare. Resection and retrieval were complete.      A 10 mm polyp was found in the sigmoid colon. The polyp was sessile. The       polyp was removed with a saline injection-lift technique using a cold       snare. Resection and retrieval were complete. To prevent bleeding after       the polypectomy, two hemostatic clips were successfully placed. There       was no bleeding during, or at the end, of the procedure.      A large amount of stool was found in the ascending colon and in the       cecum, interfering with visualization.      The exam was otherwise without abnormality on direct and retroflexion       views. Impression:            - Preparation of the colon was poor.                        - One 7 mm polyp in the rectum, removed with a cold                         snare. Resected and retrieved.                        - One 10 mm polyp in the sigmoid colon, removed using                         injection-lift and a cold snare. Resected and                         retrieved. Clips were placed.                        - Stool in the ascending colon and in the cecum.                        - The examination was otherwise normal on direct and  retroflexion views. Recommendation:        - Discharge patient to home (with escort).                        - Resume previous diet.                        - Continue present medications.                        - Await pathology results.                        - Repeat colonoscopy in 2 weeks because the bowel                         preparation was suboptimal. Procedure Code(s):     --- Professional ---                         [phone], Colonoscopy, flexible; with removal of                         tumor(s), polyp(s), or other lesion(s) by snare                         technique                        45381, Colonoscopy, flexible; with directed submucosal                         injection(s), any substance Diagnosis Code(s):     --- Professional ---                        Z12.11, Encounter for screening for malignant neoplasm                         of colon                        K62.1, Rectal polyp                        K63.5, Polyp of colon CPT copyright 2019 American [employer] Association. All rights reserved. The codes documented in this report are preliminary and upon coder review may  be revised to meet current compliance requirements. Jonathon Bellows, MD Jonathon Bellows MD, MD 08/07/2021 9:54:41 AM This report has been signed electronically. Number of Addenda: 0 Note Initiated On: 08/07/2021 9:03 AM Scope Withdrawal Time: 0 hours 5 minutes 15 seconds  Total Procedure Duration: 0 hours 13 minutes 31 seconds  Estimated Blood Loss:  Estimated blood loss: none.      [facility]

## 2021-08-08 ENCOUNTER — Encounter: Payer: Self-pay | Admitting: Gastroenterology

## 2021-08-08 LAB — SURGICAL PATHOLOGY

## 2021-08-09 ENCOUNTER — Telehealth: Payer: Self-pay | Admitting: Gastroenterology

## 2021-08-09 NOTE — Telephone Encounter (Signed)
Pt. Calling saying she s supposed to have a procedure 09/13/21 but it is a office visit. She says she has ordered her solution already. Requesting a call back

## 2021-08-10 ENCOUNTER — Other Ambulatory Visit: Payer: Self-pay

## 2021-08-10 DIAGNOSIS — K5909 Other constipation: Secondary | ICD-10-CM

## 2021-08-10 DIAGNOSIS — Z86010 Personal history of colonic polyps: Secondary | ICD-10-CM

## 2021-08-10 NOTE — Telephone Encounter (Signed)
Please read telephone note from 08/07/2021.

## 2021-08-10 NOTE — Telephone Encounter (Signed)
Patient was contacted and she agreed on getting her colonoscopy on 08/22/2021. Information will be mailed per patient's request.

## 2021-08-22 ENCOUNTER — Ambulatory Visit
Admission: RE | Admit: 2021-08-22 | Discharge: 2021-08-22 | Disposition: A | Discharge status: Home / Self Care | Payer: Medicaid Other | Attending: Gastroenterology | Admitting: Gastroenterology

## 2021-08-22 ENCOUNTER — Ambulatory Visit: Payer: Medicaid Other | Admitting: Certified Registered Nurse Anesthetist

## 2021-08-22 ENCOUNTER — Encounter
Admission: RE | Disposition: A | Discharge status: Home / Self Care | Payer: Self-pay | Source: Home / Self Care | Attending: Gastroenterology

## 2021-08-22 ENCOUNTER — Encounter: Payer: Self-pay | Admitting: Gastroenterology

## 2021-08-22 DIAGNOSIS — F17210 Nicotine dependence, cigarettes, uncomplicated: Secondary | ICD-10-CM | POA: Diagnosis not present

## 2021-08-22 DIAGNOSIS — E669 Obesity, unspecified: Secondary | ICD-10-CM | POA: Insufficient documentation

## 2021-08-22 DIAGNOSIS — J449 Chronic obstructive pulmonary disease, unspecified: Secondary | ICD-10-CM | POA: Insufficient documentation

## 2021-08-22 DIAGNOSIS — D125 Benign neoplasm of sigmoid colon: Secondary | ICD-10-CM | POA: Insufficient documentation

## 2021-08-22 DIAGNOSIS — Z1211 Encounter for screening for malignant neoplasm of colon: Secondary | ICD-10-CM

## 2021-08-22 DIAGNOSIS — Z86010 Personal history of colonic polyps: Secondary | ICD-10-CM

## 2021-08-22 DIAGNOSIS — I10 Essential (primary) hypertension: Secondary | ICD-10-CM | POA: Diagnosis not present

## 2021-08-22 DIAGNOSIS — Z79899 Other long term (current) drug therapy: Secondary | ICD-10-CM | POA: Insufficient documentation

## 2021-08-22 DIAGNOSIS — Z7951 Long term (current) use of inhaled steroids: Secondary | ICD-10-CM | POA: Insufficient documentation

## 2021-08-22 DIAGNOSIS — K635 Polyp of colon: Secondary | ICD-10-CM | POA: Diagnosis not present

## 2021-08-22 DIAGNOSIS — Z6829 Body mass index (BMI) 29.0-29.9, adult: Secondary | ICD-10-CM | POA: Insufficient documentation

## 2021-08-22 DIAGNOSIS — K5909 Other constipation: Secondary | ICD-10-CM

## 2021-08-22 HISTORY — PX: COLONOSCOPY WITH PROPOFOL: SHX5780

## 2021-08-22 SURGERY — COLONOSCOPY WITH PROPOFOL
Anesthesia: General

## 2021-08-22 MED ORDER — PROPOFOL 10 MG/ML IV BOLUS
INTRAVENOUS | Status: AC
  Filled 2021-08-22: qty 20

## 2021-08-22 MED ORDER — PROPOFOL 500 MG/50ML IV EMUL
INTRAVENOUS | Status: DC | PRN
  Administered 2021-08-22: 150 ug/kg/min via INTRAVENOUS

## 2021-08-22 MED ORDER — PROPOFOL 10 MG/ML IV BOLUS
INTRAVENOUS | Status: DC | PRN
  Administered 2021-08-22: 50 mg via INTRAVENOUS
  Administered 2021-08-22: 20 mg via INTRAVENOUS

## 2021-08-22 MED ORDER — LIDOCAINE HCL (CARDIAC) PF 100 MG/5ML IV SOSY
PREFILLED_SYRINGE | INTRAVENOUS | Status: DC | PRN
  Administered 2021-08-22: 50 mg via INTRAVENOUS

## 2021-08-22 MED ORDER — LIDOCAINE HCL (PF) 1 % IJ SOLN
INTRAMUSCULAR | Status: AC
  Filled 2021-08-22: qty 2

## 2021-08-22 MED ORDER — SODIUM CHLORIDE 0.9 % IV SOLN: INTRAVENOUS | Status: DC

## 2021-08-22 MED ORDER — PROPOFOL 500 MG/50ML IV EMUL
INTRAVENOUS | Status: AC
  Filled 2021-08-22: qty 50

## 2021-08-22 NOTE — Anesthesia Postprocedure Evaluation (Signed)
Anesthesia Post Note  Patient: Anna Cruz  Procedure(s) Performed: COLONOSCOPY WITH PROPOFOL  Patient location during evaluation: Endoscopy Anesthesia Type: General Level of consciousness: awake and alert Pain management: pain level controlled Vital Signs Assessment: post-procedure vital signs reviewed and stable Respiratory status: spontaneous breathing, nonlabored ventilation, respiratory function stable and patient connected to nasal cannula oxygen Cardiovascular status: blood pressure returned to baseline and stable Postop Assessment: no apparent nausea or vomiting Anesthetic complications: no   No notable events documented.   Last Vitals:  Vitals:   08/22/21 1424 08/22/21 1434  BP: 110/76 115/75  Pulse: 68 63  Resp: (!) 29 17  Temp:    SpO2: 98% 97%    Last Pain:  Vitals:   08/22/21 1434  TempSrc:   PainSc: 0-No pain                 Precious Haws Jayvier Burgher

## 2021-08-22 NOTE — Anesthesia Preprocedure Evaluation (Signed)
Anesthesia Evaluation  Patient identified by MRN, date of birth, ID band Patient awake    Reviewed: Allergy & Precautions, NPO status , Patient's Chart, lab work & pertinent test results  History of Anesthesia Complications Negative for: history of anesthetic complications  Airway Mallampati: II  TM Distance: >3 FB Neck ROM: Full    Dental  (+) Upper Dentures   Pulmonary asthma , COPD (uses O2 at night), Current Smoker and Patient abstained from smoking.,    breath sounds clear to auscultation- rhonchi (-) wheezing      Cardiovascular hypertension, Pt. on medications (-) CAD, (-) Past MI, (-) Cardiac Stents and (-) CABG  Rhythm:Regular Rate:Normal - Systolic murmurs and - Diastolic murmurs    Neuro/Psych neg Seizures negative neurological ROS  negative psych ROS   GI/Hepatic negative GI ROS, Neg liver ROS,   Endo/Other  negative endocrine ROSneg diabetes  Renal/GU negative Renal ROS     Musculoskeletal negative musculoskeletal ROS (+)   Abdominal (+) - obese,   Peds  Hematology negative hematology ROS (+)   Anesthesia Other Findings Past Medical History: No date: Asthma No date: Cancer ([place]) No date: COPD (chronic obstructive pulmonary disease) (HCC) No date: Hypertension   Reproductive/Obstetrics                             Anesthesia Physical Anesthesia Plan  ASA: 3  Anesthesia Plan: General   Post-op Pain Management:    Induction: Intravenous  PONV Risk Score and Plan: 1 and Propofol infusion  Airway Management Planned: Natural Airway  Additional Equipment:   Intra-op Plan:   Post-operative Plan:   Informed Consent: I have reviewed the patients History and Physical, chart, labs and discussed the procedure including the risks, benefits and alternatives for the proposed anesthesia with the patient or authorized representative who has indicated his/her understanding and  acceptance.     Dental advisory given  Plan Discussed with: CRNA and Anesthesiologist  Anesthesia Plan Comments:         Anesthesia Quick Evaluation

## 2021-08-22 NOTE — Op Note (Signed)
[facility] Patient Name: Anna Cruz Procedure Date: 08/22/2021 1:30 PM MRN: [id number] Account #: [id number] Date of Birth: 04/23/1961 Admit Type: Outpatient Age: 60 Room: [facility] ENDO ROOM 3 Gender: Female Note Status: Finalized Instrument Name: Park Meo [id number] Procedure:             Colonoscopy Indications:           Screening for colorectal malignant neoplasm Providers:             Jonathon Bellows MD, MD Referring MD:          [place], MD (Referring MD) Medicines:             Monitored Anesthesia Care Complications:         No immediate complications. Procedure:             Pre-Anesthesia Assessment:                        - Prior to the procedure, a History and Physical was                         performed, and patient medications, allergies and                         sensitivities were reviewed. The patient's tolerance                         of previous anesthesia was reviewed.                        - The risks and benefits of the procedure and the                         sedation options and risks were discussed with the                         patient. All questions were answered and informed                         consent was obtained.                        - ASA Grade Assessment: II - A patient with mild                         systemic disease.                        After obtaining informed consent, the colonoscope was                         passed under direct vision. Throughout the procedure,                         the patient's blood pressure, pulse, and oxygen                         saturations were monitored continuously. The  Colonoscope was introduced through the anus and                         advanced to the the cecum, identified by the                         appendiceal orifice. The colonoscopy was performed                         with ease. The patient  tolerated the procedure well.                         The quality of the bowel preparation was inadequate. Findings:      The perianal and digital rectal examinations were normal.      A 5 mm polyp was found in the sigmoid colon. The polyp was sessile. The       polyp was removed with a cold snare. Resection and retrieval were       complete. Borders of the polyp were marked using blue/      NBI light      A 10 mm polyp was found in the sigmoid colon. The polyp was sessile.       Preparations were made for mucosal resection. Saline was injected to       raise the lesion. Snare mucosal resection was performed. Resection and       retrieval were complete. To prevent bleeding after the polypectomy, two       hemostatic clips were successfully placed. There was no bleeding during,       or at the end, of the procedure.      A large amount of semi-liquid stool was found in the entire colon. Impression:            - Preparation of the colon was inadequate.                        - One 5 mm polyp in the sigmoid colon, removed with a                         cold snare. Resected and retrieved.                        - One 10 mm polyp in the sigmoid colon, removed with                         mucosal resection. Resected and retrieved. Clips were                         placed.                        - Stool in the entire examined colon.                        - Mucosal resection was performed. Resection and                         retrieval were complete. Recommendation:        - Discharge patient to home (with escort).                        -  Resume previous diet.                        - Continue present medications.                        - Await pathology results.                        - Repeat colonoscopy in 6 months because the bowel                         preparation was suboptimal. Procedure Code(s):     --- Professional ---                        [phone], Colonoscopy, flexible; with  endoscopic mucosal                         resection                        45385, 58, Colonoscopy, flexible; with removal of                         tumor(s), polyp(s), or other lesion(s) by snare                         technique Diagnosis Code(s):     --- Professional ---                        Z12.11, Encounter for screening for malignant neoplasm                         of colon                        K63.5, Polyp of colon CPT copyright 2019 American Medical Association. All rights reserved. The codes documented in this report are preliminary and upon coder review may  be revised to meet current compliance requirements. Jonathon Bellows, MD Jonathon Bellows MD, MD 08/22/2021 2:03:18 PM This report has been signed electronically. Number of Addenda: 0 Note Initiated On: 08/22/2021 1:30 PM Total Procedure Duration: 0 hours 20 minutes 5 seconds  Estimated Blood Loss:  Estimated blood loss: none.      [facility]

## 2021-08-22 NOTE — Transfer of Care (Signed)
Immediate Anesthesia Transfer of Care Note  Patient: Anna Cruz  Procedure(s) Performed: COLONOSCOPY WITH PROPOFOL  Patient Location: PACU  Anesthesia Type:General  Level of Consciousness: awake, alert  and oriented  Airway & Oxygen Therapy: Patient Spontanous Breathing and Patient connected to nasal cannula oxygen  Post-op Assessment: Report given to RN and Post -op Vital signs reviewed and stable  Post vital signs: Reviewed and stable  Last Vitals:  Vitals Value Taken Time  BP    Temp    Pulse    Resp    SpO2      Last Pain:  Vitals:   08/22/21 1314  TempSrc: Temporal         Complications: No notable events documented.

## 2021-08-22 NOTE — H&P (Signed)
Jonathon Bellows, MD [address] [place], [place], [place], [address] [address], [place], [place], [place], [address] Phone: [phone]  Fax: [phone]  Primary Care Physician:  [facility]   Pre-Procedure History & Physical: HPI:  Anna Cruz is a 60 y.o. female is here for an colonoscopy.   Past Medical History:  Diagnosis Date   Asthma    Cancer ([place])    COPD (chronic obstructive pulmonary disease) ([place])    Hypertension     Past Surgical History:  Procedure Laterality Date   cesection     CHOLECYSTECTOMY     COLONOSCOPY WITH PROPOFOL N/A 08/07/2021   Procedure: COLONOSCOPY WITH PROPOFOL;  Surgeon: Jonathon Bellows, MD;  Location: [facility] ENDOSCOPY;  Service: Gastroenterology;  Laterality: N/A;   ESOPHAGOGASTRODUODENOSCOPY (EGD) WITH PROPOFOL N/A 05/23/2021   Procedure: ESOPHAGOGASTRODUODENOSCOPY (EGD) WITH PROPOFOL;  Surgeon: Lesly Rubenstein, MD;  Location: [facility] ENDOSCOPY;  Service: Endoscopy;  Laterality: N/A;   FLEXIBLE SIGMOIDOSCOPY N/A 05/22/2021   Procedure: FLEXIBLE SIGMOIDOSCOPY;  Surgeon: Lesly Rubenstein, MD;  Location: [facility] ENDOSCOPY;  Service: Endoscopy;  Laterality: N/A;    Prior to Admission medications   Medication Sig Start Date End Date Taking? Authorizing Provider  albuterol (PROVENTIL) (2.5 MG/3ML) 0.083% nebulizer solution Take 2.5 mg by nebulization every 4 (four) hours as needed for wheezing or shortness of breath.   Yes [provider]  albuterol (VENTOLIN HFA) 108 (90 Base) MCG/ACT inhaler Inhale 2 puffs into the lungs every 4 (four) hours as needed for wheezing or shortness of breath.   Yes [provider]  atorvastatin (LIPITOR) 20 MG tablet Take 20 mg by mouth daily.   Yes [provider]  citalopram (CELEXA) 20 MG tablet Take 20 mg by mouth daily.   Yes [provider]  docusate sodium (COLACE) 100 MG capsule Take 200 mg by mouth daily.   Yes [provider]  ferrous  sulfate 325 (65 FE) MG tablet Take 1 tablet (325 mg total) by mouth daily with breakfast. 06/06/21  Yes Elodia Florence., MD  FLUoxetine (PROZAC) 40 MG capsule Take 40 mg by mouth 2 (two) times daily. 04/17/21  Yes [provider]  fluticasone (FLONASE) 50 MCG/ACT nasal spray Place 1 spray into both nostrils daily. 02/14/21  Yes [provider]  fluticasone-salmeterol (ADVAIR) 250-50 MCG/ACT AEPB Inhale 1 puff into the lungs in the morning and at bedtime. 04/24/21  Yes [provider]  folic acid (FOLVITE) 1 MG tablet Take 1 mg by mouth daily.   Yes [provider]  furosemide (LASIX) 20 MG tablet Take 20 mg by mouth daily.   Yes [provider]  montelukast (SINGULAIR) 10 MG tablet Take 10 mg by mouth daily. 12/12/20  Yes [provider]  Multiple Vitamin (MULTIVITAMIN) tablet Take 1 tablet by mouth daily.   Yes [provider]  pantoprazole (PROTONIX) 40 MG tablet Take 40 mg by mouth daily.   Yes [provider]  polyethylene glycol-electrolytes (NULYTELY) 420 g solution Two days before procedure at 5 PM drink 8 oz glass of mixture every 15 minutes until you finish half of the jug. Then at 5 PM the day before procedure drink 8 oz glass of mixture every 15 minutes up to half of the jug. Then 5 hours before your procedure drink 8 oz glass of mixture every 15 minutes until you finish the jug. 08/07/21  Yes Jonathon Bellows, MD  prazosin (MINIPRESS) 1 MG capsule Take 2  mg by mouth at bedtime.   Yes [provider]  QUEtiapine (SEROQUEL) 300 MG tablet Take 600 mg by mouth at bedtime.   Yes [provider]  traZODone (DESYREL) 100 MG tablet Take 200 mg by mouth at bedtime.   Yes [provider]  zolpidem (AMBIEN) 10 MG tablet Take 10 mg by mouth at bedtime.   Yes [provider]  Ascorbic Acid (VITAMIN C) 1000 MG tablet Take 1,000 mg by mouth daily.    [provider]  cyanocobalamin (,VITAMIN  B-12,) 1000 MCG/ML injection Inject 1,000 mcg into the muscle every 30 (thirty) days.    [provider]  hydrOXYzine (ATARAX/VISTARIL) 50 MG tablet Take 100 mg by mouth every 8 (eight) hours as needed for itching or anxiety.    [provider]  linaclotide (LINZESS) 145 MCG CAPS capsule Take 145 mcg by mouth daily.    [provider]  loratadine (CLARITIN) 10 MG tablet Take 10 mg by mouth daily.    [provider]  nicotine (NICODERM CQ - DOSED IN MG/24 HOURS) 21 mg/24hr patch Place 21 mg onto the skin daily.    [provider]  ondansetron (ZOFRAN) 8 MG tablet Take 8 mg by mouth every 8 (eight) hours as needed for nausea.    [provider]  Sodium Phosphates (ENEMA) 7-19 GM/118ML ENEM SMARTSIG:Rectally Once 05/17/21   [provider]  tiotropium (SPIRIVA) 18 MCG inhalation capsule Place 18 mcg into inhaler and inhale daily.    [provider]    Allergies as of 08/10/2021   (No Known Allergies)    History reviewed. No pertinent family history.  Social History   Socioeconomic History   Marital status: Married    Spouse name: Not on file   Number of children: Not on file   Years of education: Not on file   Highest education level: Not on file  Occupational History   Not on file  Tobacco Use   Smoking status: Some Days    Packs/day: 0.50    Types: Cigarettes   Smokeless tobacco: Never  Vaping Use   Vaping Use: Never used  Substance and Sexual Activity   Alcohol use: Not Currently    Comment: recovering alcoholic   Drug use: Not Currently   Sexual activity: Not on file  Other Topics Concern   Not on file  Social History Narrative   Not on file   Social Determinants of Health   Financial Resource Strain: Not on file  Food Insecurity: Not on file  Transportation Needs: Not on file  Physical Activity: Not on file  Stress: Not on file  Social Connections: Not on file  Intimate Partner Violence: Not on  file    Review of Systems: See HPI, otherwise negative ROS  Physical Exam: BP 120/82   Pulse 69   Temp (!) 96.4 F (35.8 C) (Temporal)   Resp 18   Ht 5\' 1"  (1.549 m)   Wt 71.2 kg   SpO2 97%   BMI 29.66 kg/m  General:   Alert,  pleasant and cooperative in NAD Head:  Normocephalic and atraumatic. Neck:  Supple; no masses or thyromegaly. Lungs:  Clear throughout to auscultation, normal respiratory effort.    Heart:  +S1, +S2, Regular rate and rhythm, No edema. Abdomen:  Soft, nontender and nondistended. Normal bowel sounds, without guarding, and without rebound.   Neurologic:  Alert and  oriented x4;  grossly normal neurologically.  Impression/Plan: Anna Cruz is here for  an colonoscopy to be performed for Screening colonoscopy average risk   Risks, benefits, limitations, and alternatives regarding  colonoscopy have been reviewed with the patient.  Questions have been answered.  All parties agreeable.   Jonathon Bellows, MD  08/22/2021, 1:30 PM

## 2021-08-23 ENCOUNTER — Encounter: Payer: Self-pay | Admitting: Gastroenterology

## 2021-08-24 ENCOUNTER — Telehealth: Payer: Self-pay

## 2021-08-24 LAB — SURGICAL PATHOLOGY

## 2021-08-24 NOTE — Telephone Encounter (Signed)
Dr. Vicente Males asked me to call patient to make sure that she was doing well since she was having some abdominal pain after her procedure. Called patient and she answered and stated that she is feeling way better today than yesterday. She stated that she developed a fever of 101 F last night but now her temp is 97.3 F. I recommended for her her to go to the ED in case her fever and/or abdominal pain came back and it was unbearable. Patient agreed and stated that she would.

## 2021-08-29 ENCOUNTER — Encounter: Payer: Self-pay | Admitting: Gastroenterology

## 2021-09-13 ENCOUNTER — Ambulatory Visit: Payer: Medicaid Other | Admitting: Gastroenterology

## 2021-09-13 ENCOUNTER — Other Ambulatory Visit: Payer: Self-pay

## 2021-09-20 ENCOUNTER — Other Ambulatory Visit (HOSPITAL_BASED_OUTPATIENT_CLINIC_OR_DEPARTMENT_OTHER): Payer: Self-pay | Admitting: Specialist

## 2021-09-20 ENCOUNTER — Other Ambulatory Visit: Payer: Self-pay | Admitting: Specialist

## 2021-09-20 DIAGNOSIS — R911 Solitary pulmonary nodule: Secondary | ICD-10-CM

## 2021-09-20 DIAGNOSIS — R0602 Shortness of breath: Secondary | ICD-10-CM

## 2021-10-15 ENCOUNTER — Ambulatory Visit: Payer: Medicaid Other | Attending: Specialist

## 2021-11-06 ENCOUNTER — Ambulatory Visit: Payer: Medicaid Other | Admitting: Gastroenterology

## 2021-12-20 DIAGNOSIS — M1711 Unilateral primary osteoarthritis, right knee: Secondary | ICD-10-CM | POA: Insufficient documentation

## 2021-12-20 DIAGNOSIS — F419 Anxiety disorder, unspecified: Secondary | ICD-10-CM | POA: Insufficient documentation

## 2021-12-20 DIAGNOSIS — F32A Depression, unspecified: Secondary | ICD-10-CM | POA: Insufficient documentation

## 2021-12-20 DIAGNOSIS — J9611 Chronic respiratory failure with hypoxia: Secondary | ICD-10-CM | POA: Insufficient documentation

## 2021-12-26 ENCOUNTER — Other Ambulatory Visit: Payer: Self-pay

## 2021-12-26 ENCOUNTER — Ambulatory Visit: Payer: Medicaid Other | Admitting: Gastroenterology

## 2021-12-26 NOTE — Patient Instructions (Incomplete)
Anna Bellows MD, MRCP(U.K) [address]  [place]  [place], [place] [address]  Main: [phone]  Fax: [phone]   Primary Care Physician: [facility]  Primary Gastroenterologist:  Dr. Jonathon Cruz   No chief complaint on file.   HPI: Anna Cruz is a 61 y.o. female  Summary of history :   Initially referred and seen on 05/17/2021 for GERD ,elevated alkaline phosphatase 169 ( not related to liver as GGT was normal )and constipation. (bowel movement only once a week).  Failed multiple OTC meds.  No unintentional weight loss. She presented to the hospital and was admitted with severe constipation.  Given enemas underwent a colonoscopy.  05/18/2021 abdominal x-ray showed with stool throughout the descending and sigmoid colon.  Possible nephrolithiasis. 05/18/2021 CT scan of the abdomen pelvis with contrast showed moderate distention of the colon with fluid in stool all the way down to the rectosigmoid moderate to large hiatal hernia.  05/22/2021: Colonoscopy no gross abnormalities noted.  Prep was poor. 05/23/2021: EGD: 6 cm hiatal hernia noted otherwise normal.  05/17/2021: PTH, GGTnormal .  Alkaline phosphatase 153. Smooth muscle antibody and ANA was negative    Interval history  08/02/2021-12/26/2021         05/23/2021: Hemoglobin 9.5 g   She is doing well.  Not had a bowel meant for 3 days.  She denies receiving any samples the last time she came to our office for the visit nor did she say that she was discharged on any laxatives.  Feels like she is getting constipated presently.    Current Outpatient Medications  Medication Sig Dispense Refill   albuterol (PROVENTIL) (2.5 MG/3ML) 0.083% nebulizer solution Take 2.5 mg by nebulization every 4 (four) hours as needed for wheezing or shortness of breath.     albuterol (VENTOLIN HFA) 108 (90 Base) MCG/ACT inhaler Inhale 2 puffs into the lungs every 4 (four) hours as needed for wheezing or shortness of  breath.     Ascorbic Acid (VITAMIN C) 1000 MG tablet Take 1,000 mg by mouth daily.     atorvastatin (LIPITOR) 20 MG tablet Take 20 mg by mouth daily.     buPROPion (WELLBUTRIN SR) 150 MG 12 hr tablet Take 150 mg by mouth 2 (two) times daily.     citalopram (CELEXA) 20 MG tablet Take 20 mg by mouth daily.     cyanocobalamin (,VITAMIN B-12,) 1000 MCG/ML injection Inject 1,000 mcg into the muscle every 30 (thirty) days.     Cyanocobalamin 1000 MCG CAPS Take 1 tablet by mouth daily.     docusate sodium (COLACE) 100 MG capsule Take 200 mg by mouth daily.     ferrous sulfate 325 (65 FE) MG tablet Take 1 tablet (325 mg total) by mouth daily with breakfast. 30 tablet 1   FLUoxetine (PROZAC) 40 MG capsule Take 2 capsules by mouth in the morning.     fluticasone (FLONASE) 50 MCG/ACT nasal spray Place 1 spray into both nostrils daily.     fluticasone-salmeterol (ADVAIR) 250-50 MCG/ACT AEPB Inhale 1 puff into the lungs in the morning and at bedtime.     folic acid (FOLVITE) 1 MG tablet Take 1 mg by mouth daily.     furosemide (LASIX) 20 MG tablet Take 20 mg by mouth daily.     hydrOXYzine (ATARAX/VISTARIL) 50 MG tablet Take 100 mg by mouth every 8 (eight) hours as needed for itching or anxiety.     hydrOXYzine (ATARAX/VISTARIL) 50 MG tablet  Take 1 tablet by mouth in the morning, at noon, and at bedtime.     KLOR-CON M20 20 MEQ tablet Take 20 mEq by mouth 2 (two) times daily.     linaclotide (LINZESS) 145 MCG CAPS capsule Take 145 mcg by mouth daily.     loratadine (CLARITIN) 10 MG tablet Take 10 mg by mouth daily.     metoprolol succinate (TOPROL-XL) 50 MG 24 hr tablet Take 50 mg by mouth daily.     montelukast (SINGULAIR) 10 MG tablet Take 10 mg by mouth daily.     Multiple Vitamin (MULTIVITAMIN) tablet Take 1 tablet by mouth daily.     nicotine (NICODERM CQ - DOSED IN MG/24 HOURS) 21 mg/24hr patch Place 21 mg onto the skin daily.     ondansetron (ZOFRAN) 8 MG tablet Take 8 mg by mouth every 8 (eight)  hours as needed for nausea.     pantoprazole (PROTONIX) 40 MG tablet Take 40 mg by mouth daily.     polyethylene glycol-electrolytes (NULYTELY) 420 g solution Two days before procedure at 5 PM drink 8 oz glass of mixture every 15 minutes until you finish half of the jug. Then at 5 PM the day before procedure drink 8 oz glass of mixture every 15 minutes up to half of the jug. Then 5 hours before your procedure drink 8 oz glass of mixture every 15 minutes until you finish the jug. 4000 mL 0   prazosin (MINIPRESS) 1 MG capsule Take 2 mg by mouth at bedtime.     QUEtiapine (SEROQUEL) 300 MG tablet Take 600 mg by mouth at bedtime.     Sodium Phosphates (ENEMA) 7-19 GM/118ML ENEM SMARTSIG:Rectally Once     tiotropium (SPIRIVA) 18 MCG inhalation capsule Place 18 mcg into inhaler and inhale daily.     traZODone (DESYREL) 100 MG tablet Take 200 mg by mouth at bedtime.     TRULANCE 3 MG TABS Take 1 tablet by mouth daily.     zolpidem (AMBIEN) 10 MG tablet Take 10 mg by mouth at bedtime.     No current facility-administered medications for this visit.    Allergies as of 12/26/2021   (No Known Allergies)    ROS:  General: Negative for anorexia, weight loss, fever, chills, fatigue, weakness. ENT: Negative for hoarseness, difficulty swallowing , nasal congestion. CV: Negative for chest pain, angina, palpitations, dyspnea on exertion, peripheral edema.  Respiratory: Negative for dyspnea at rest, dyspnea on exertion, cough, sputum, wheezing.  GI: See history of present illness. GU:  Negative for dysuria, hematuria, urinary incontinence, urinary frequency, nocturnal urination.  Endo: Negative for unusual weight change.    Physical Examination:   There were no vitals taken for this visit.  General: Well-nourished, well-developed in no acute distress.  Eyes: No icterus. Conjunctivae pink. Mouth: Oropharyngeal mucosa moist and pink , no lesions erythema or exudate. Lungs: Clear to auscultation  bilaterally. Non-labored. Heart: Regular rate and rhythm, no murmurs rubs or gallops.  Abdomen: Bowel sounds are normal, nontender, nondistended, no hepatosplenomegaly or masses, no abdominal bruits or hernia , no rebound or guarding.   Extremities: No lower extremity edema. No clubbing or deformities. Neuro: Alert and oriented x 3.  Grossly intact. Skin: Warm and dry, no jaundice.   Psych: Alert and cooperative, normal mood and affect.   Imaging Studies: No results found.  Assessment and Plan:   Rolene Andrades is a 61 y.o. y/o female here to follow up  for severe constipation.  Incidentally noted  to have elevated alkaline phosphatase which is not related to the liver as GGT normal.     Plan 1.  We will need a repeat colonoscopy with 2-day prep to check for polyps as prep was inadequate for colonoscopy during the procedure.  Personal history of previous colon polyps 2.  Commence on Tr[facility]: 2-week sample provided     Dr Anna Bellows  MD,MRCP [facility]) Follow up in ***

## 2022-06-14 ENCOUNTER — Other Ambulatory Visit: Payer: Self-pay | Admitting: Gastroenterology

## 2022-09-05 IMAGING — DX DG ABDOMEN 1V
2 series · 2 of 2 positions shown · non-contrast
Comparison: None.

CLINICAL DATA: Constipation.  Vomiting.  Dizziness.

EXAM:
ABDOMEN - 1 VIEW; ABDOMEN - 2 VIEW

[abdomen supine (1 of 2)]
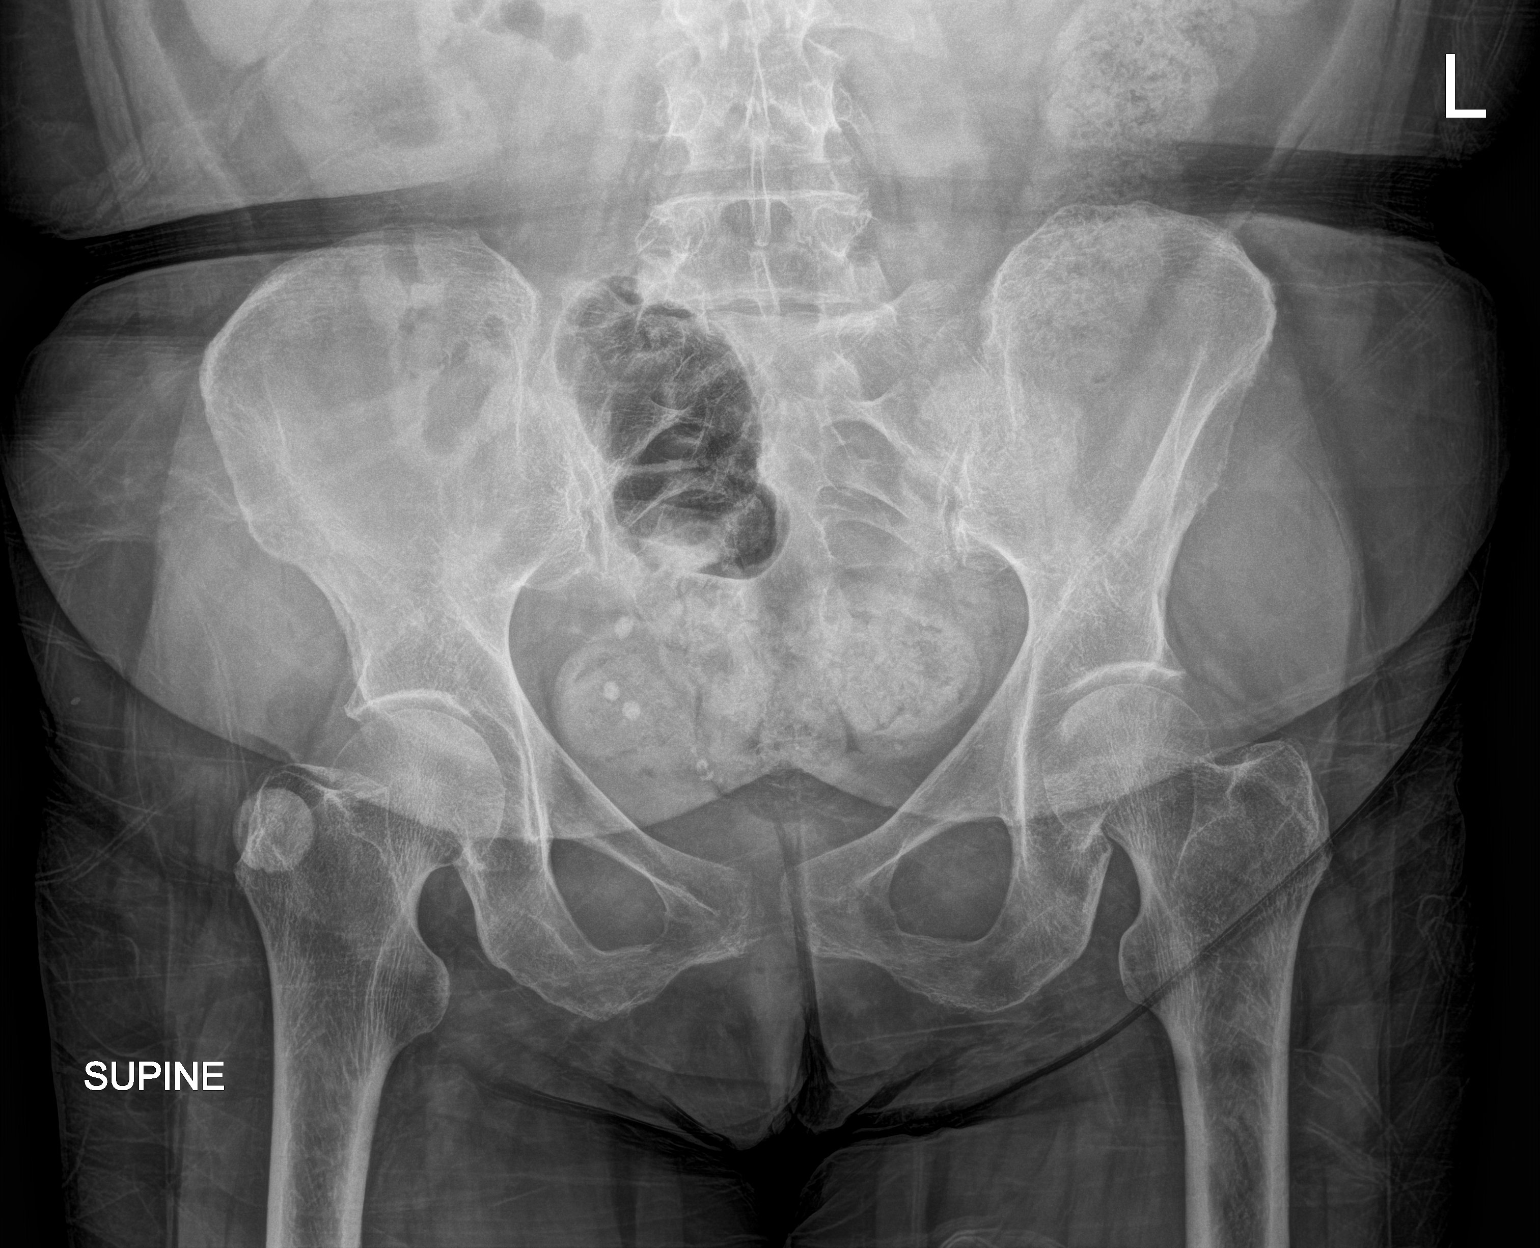

[abdomen supine (2 of 2)]
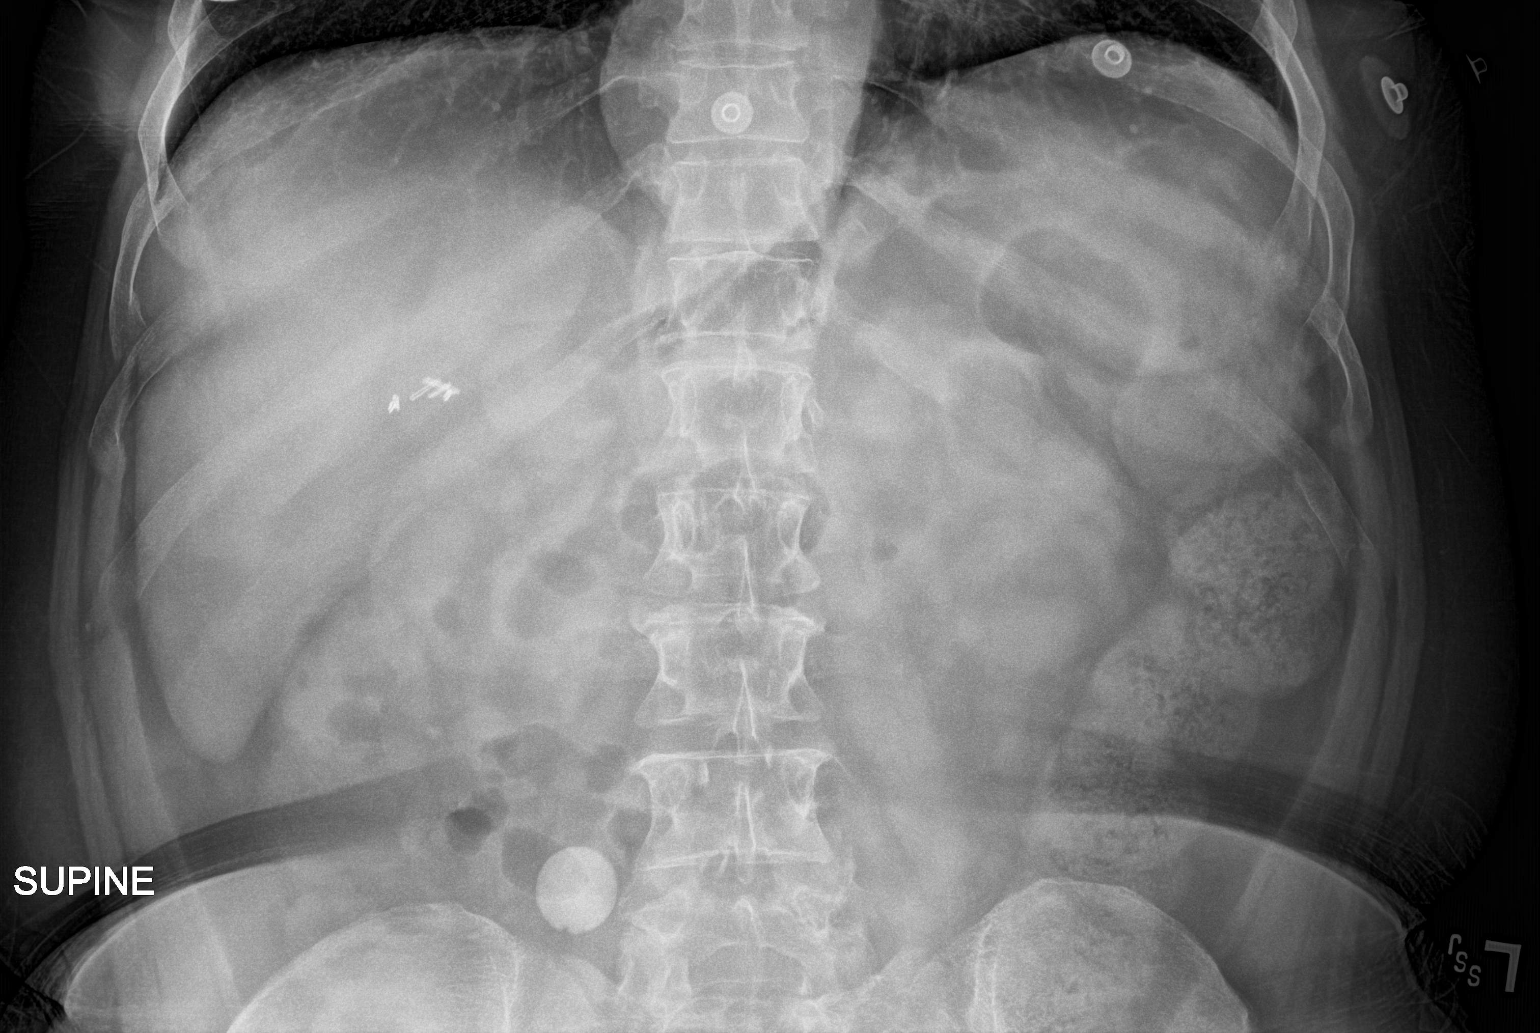

[2 of 2 positions shown; findings below may reference images not displayed]

FINDINGS: Nonobstructive bowel gas pattern. Nonspecific air-fluid level within
the right mid abdomen. Stool throughout the descending sigmoid
colon.

Round punctate density overlying the pelvis likely represent
phleboliths. There is an oval approximately a 0.8 cm calcific
density overlying the right renal shadow that may represent
nephrolithiasis. Right upper quadrant surgical clips.

Visualized lung bases grossly unremarkable.
IMPRESSION: 1. Nonobstructive bowel gas pattern with stool throughout the
descending and sigmoid colon.
2. Oval 0.8 cm calcific density overlying the right renal shadow may
represent nephrolithiasis.

## 2022-09-23 IMAGING — CR DG CHEST 2V
1 series · 2 of 2 positions shown · non-contrast
Comparison: 07/10/2020 chest radiographs; CT abdomen and pelvis
05/22/2021

CLINICAL DATA: BILATERAL pleural effusions

EXAM:
CHEST - 2 VIEW

[Series 1: dg chest 2 view · 0.14mm/px · 2 of 2 slices shown]
[im 1/2]
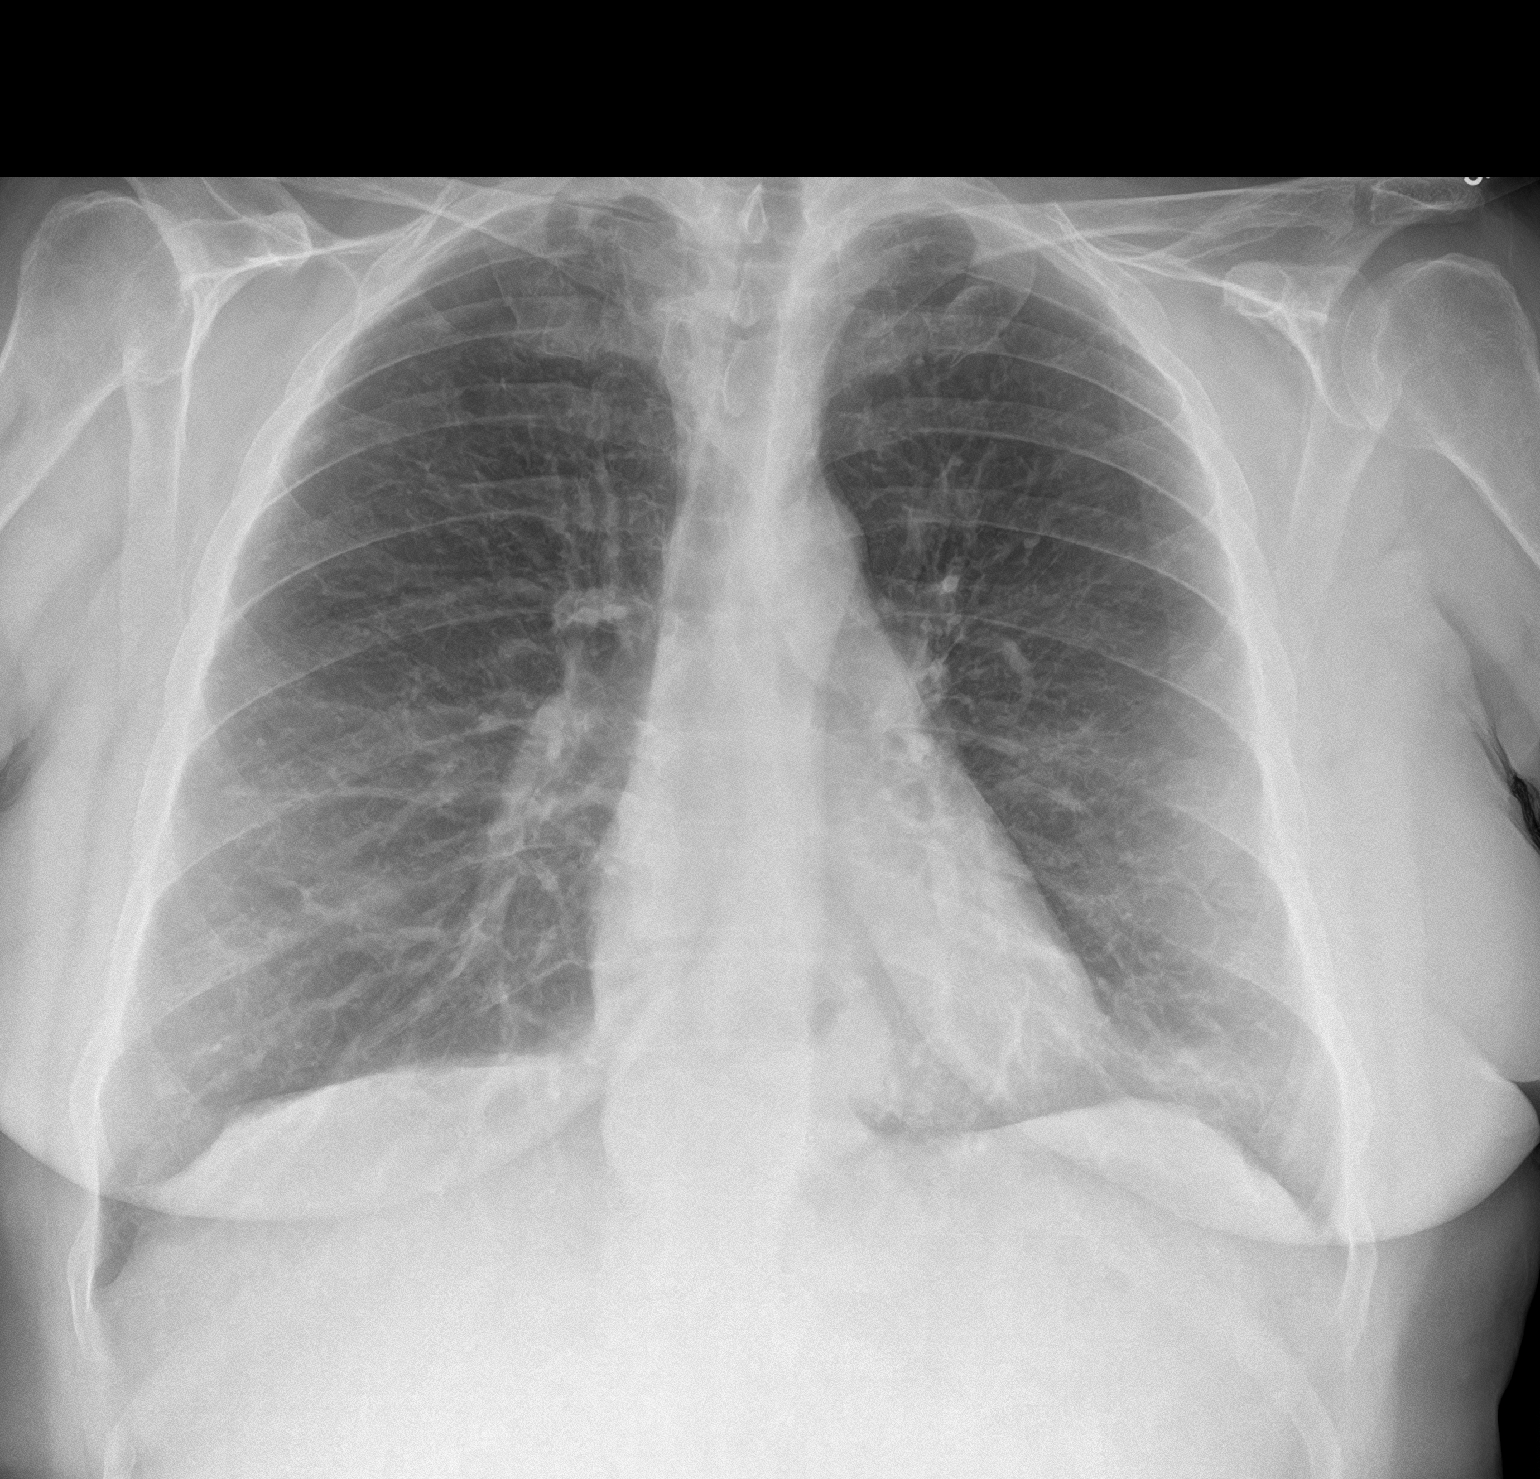
[im 2/2]
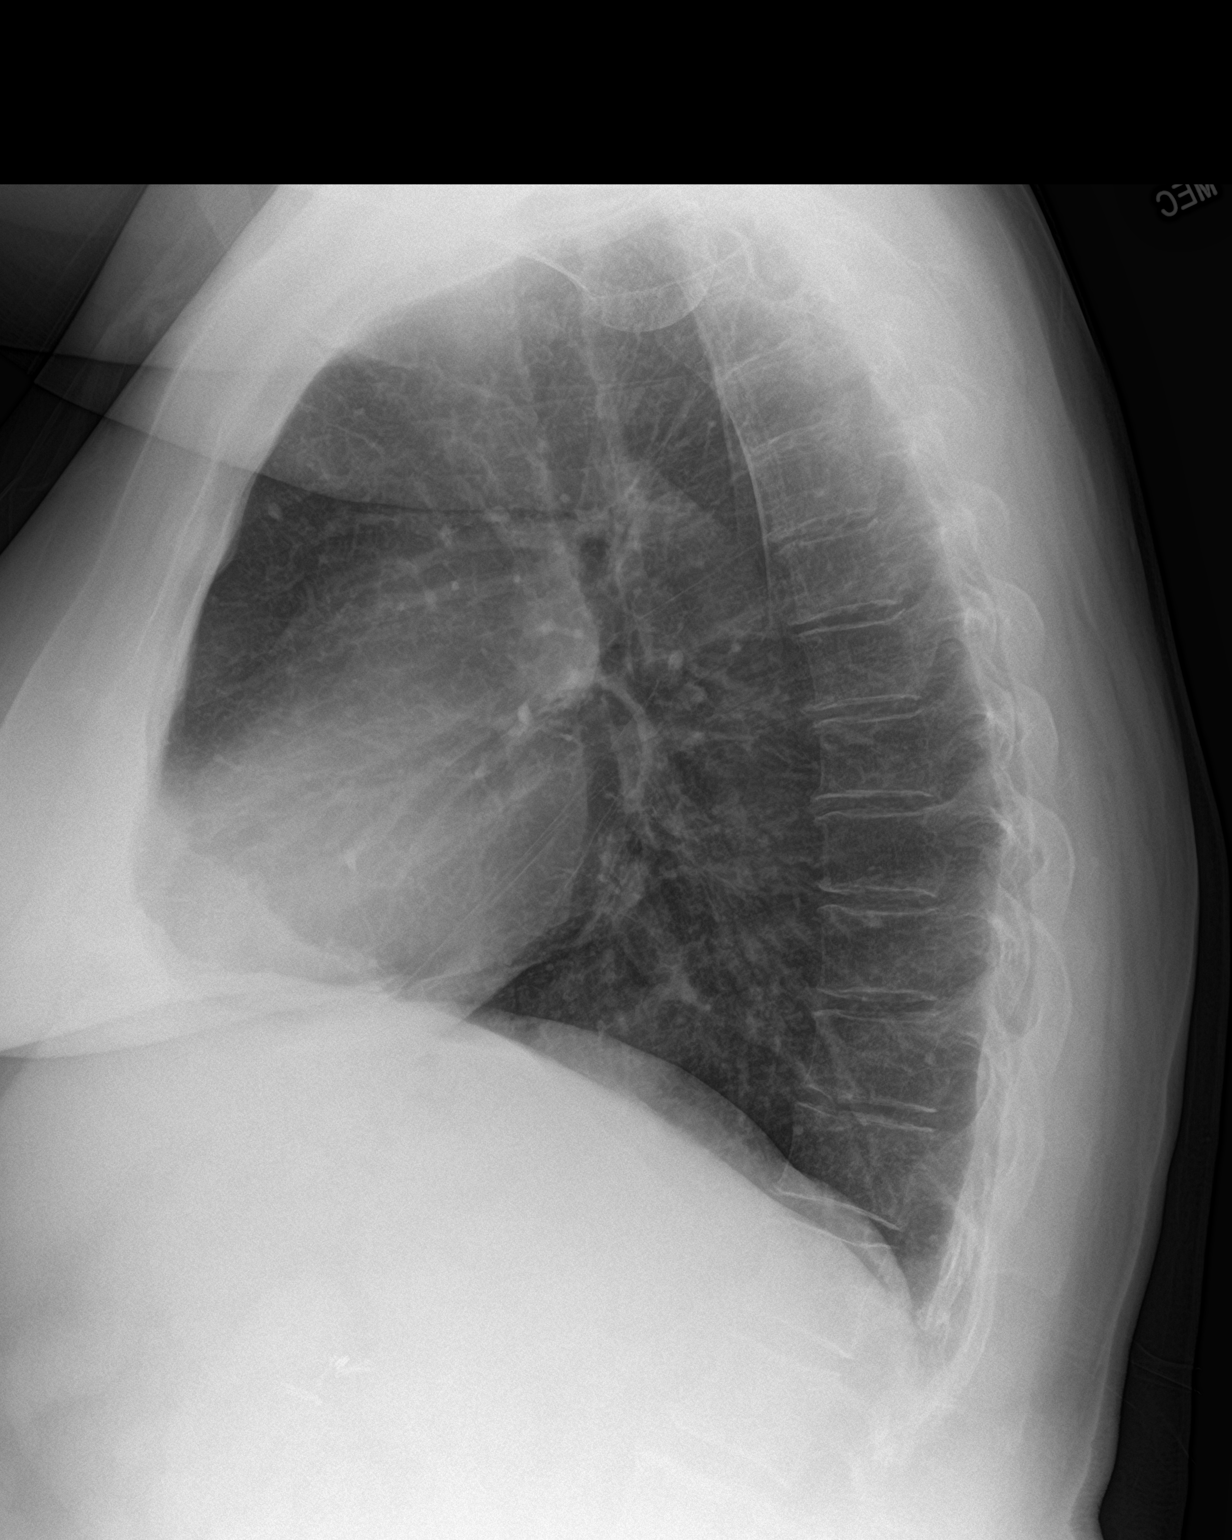

[2 of 2 positions shown; findings below may reference images not displayed]

FINDINGS: Normal heart size and pulmonary vascularity.

Small hiatal hernia.

Mild chronic bronchitic changes and minimal RIGHT apical scarring.

No acute infiltrate, pleural effusion, or pneumothorax.

Bones demineralized.
IMPRESSION: Small hiatal hernia.

No acute abnormalities.

Small pleural effusions and basilar atelectasis seen on prior CT
abdomen exam no longer identified.
